# Patient Record
Sex: Male | Born: 1975 | Race: Black or African American | Hispanic: No | Marital: Married | Smoking: Never smoker
Health system: Southern US, Community
[De-identification: ages and names within clinical notes are randomized; demographics above are authoritative.]

## PROBLEM LIST (undated history)

## (undated) DIAGNOSIS — I10 Essential (primary) hypertension: Secondary | ICD-10-CM

## (undated) DIAGNOSIS — Z8711 Personal history of peptic ulcer disease: Secondary | ICD-10-CM

---

## 2001-01-15 ENCOUNTER — Ambulatory Visit (HOSPITAL_COMMUNITY): Admission: RE | Admit: 2001-01-15 | Discharge: 2001-01-15 | Payer: Self-pay | Admitting: Neurology

## 2001-01-15 ENCOUNTER — Encounter: Payer: Self-pay | Admitting: Neurology

## 2003-05-05 ENCOUNTER — Encounter: Payer: Self-pay | Admitting: Emergency Medicine

## 2003-05-05 ENCOUNTER — Emergency Department (HOSPITAL_COMMUNITY): Admission: EM | Admit: 2003-05-05 | Discharge: 2003-05-06 | Payer: Self-pay | Admitting: Emergency Medicine

## 2003-06-20 ENCOUNTER — Encounter (HOSPITAL_COMMUNITY): Admission: RE | Admit: 2003-06-20 | Discharge: 2003-07-20 | Payer: Self-pay | Admitting: Orthopedic Surgery

## 2003-07-27 ENCOUNTER — Emergency Department (HOSPITAL_COMMUNITY): Admission: EM | Admit: 2003-07-27 | Discharge: 2003-07-27 | Payer: Self-pay | Admitting: Emergency Medicine

## 2003-07-27 ENCOUNTER — Encounter: Payer: Self-pay | Admitting: Emergency Medicine

## 2004-01-21 ENCOUNTER — Emergency Department (HOSPITAL_COMMUNITY): Admission: AD | Admit: 2004-01-21 | Discharge: 2004-01-21 | Payer: Self-pay | Admitting: Emergency Medicine

## 2005-01-22 ENCOUNTER — Emergency Department: Payer: Self-pay | Admitting: Emergency Medicine

## 2005-09-08 ENCOUNTER — Emergency Department: Payer: Self-pay | Admitting: Emergency Medicine

## 2005-09-10 ENCOUNTER — Emergency Department: Payer: Self-pay | Admitting: Emergency Medicine

## 2006-05-02 ENCOUNTER — Emergency Department: Payer: Self-pay | Admitting: Emergency Medicine

## 2006-05-11 ENCOUNTER — Emergency Department (HOSPITAL_COMMUNITY): Admission: EM | Admit: 2006-05-11 | Discharge: 2006-05-11 | Payer: Self-pay | Admitting: Emergency Medicine

## 2006-05-26 ENCOUNTER — Emergency Department: Payer: Self-pay | Admitting: Internal Medicine

## 2007-05-24 ENCOUNTER — Emergency Department: Payer: Self-pay | Admitting: Emergency Medicine

## 2009-04-09 ENCOUNTER — Emergency Department (HOSPITAL_COMMUNITY): Admission: EM | Admit: 2009-04-09 | Discharge: 2009-04-09 | Payer: Self-pay | Admitting: Emergency Medicine

## 2009-04-10 ENCOUNTER — Emergency Department (HOSPITAL_COMMUNITY): Admission: EM | Admit: 2009-04-10 | Discharge: 2009-04-10 | Payer: Self-pay | Admitting: Emergency Medicine

## 2009-07-25 ENCOUNTER — Emergency Department (HOSPITAL_COMMUNITY): Admission: EM | Admit: 2009-07-25 | Discharge: 2009-07-25 | Payer: Self-pay | Admitting: Emergency Medicine

## 2009-11-29 ENCOUNTER — Emergency Department (HOSPITAL_COMMUNITY): Admission: EM | Admit: 2009-11-29 | Discharge: 2009-11-29 | Payer: Self-pay | Admitting: Emergency Medicine

## 2010-08-05 ENCOUNTER — Emergency Department (HOSPITAL_COMMUNITY): Admission: EM | Admit: 2010-08-05 | Discharge: 2010-08-05 | Payer: Self-pay | Admitting: Emergency Medicine

## 2010-12-19 LAB — RAPID STREP SCREEN (MED CTR MEBANE ONLY): Streptococcus, Group A Screen (Direct): NEGATIVE

## 2011-04-10 ENCOUNTER — Emergency Department (HOSPITAL_COMMUNITY)
Admission: EM | Admit: 2011-04-10 | Discharge: 2011-04-10 | Disposition: A | Discharge status: Home / Self Care | Payer: 59 | Attending: Emergency Medicine | Admitting: Emergency Medicine

## 2011-04-10 DIAGNOSIS — K047 Periapical abscess without sinus: Secondary | ICD-10-CM | POA: Insufficient documentation

## 2013-12-11 ENCOUNTER — Encounter (HOSPITAL_COMMUNITY): Payer: Self-pay | Admitting: Emergency Medicine

## 2013-12-11 ENCOUNTER — Emergency Department (HOSPITAL_COMMUNITY)
Admission: EM | Admit: 2013-12-11 | Discharge: 2013-12-11 | Disposition: A | Discharge status: Home / Self Care | Payer: Medicaid Other | Attending: Emergency Medicine | Admitting: Emergency Medicine

## 2013-12-11 DIAGNOSIS — R109 Unspecified abdominal pain: Secondary | ICD-10-CM

## 2013-12-11 DIAGNOSIS — R1013 Epigastric pain: Secondary | ICD-10-CM | POA: Insufficient documentation

## 2013-12-11 DIAGNOSIS — R51 Headache: Secondary | ICD-10-CM | POA: Insufficient documentation

## 2013-12-11 DIAGNOSIS — D72819 Decreased white blood cell count, unspecified: Secondary | ICD-10-CM | POA: Insufficient documentation

## 2013-12-11 DIAGNOSIS — R519 Headache, unspecified: Secondary | ICD-10-CM

## 2013-12-11 DIAGNOSIS — R1012 Left upper quadrant pain: Secondary | ICD-10-CM | POA: Insufficient documentation

## 2013-12-11 DIAGNOSIS — R1011 Right upper quadrant pain: Secondary | ICD-10-CM | POA: Insufficient documentation

## 2013-12-11 LAB — CBC WITH DIFFERENTIAL/PLATELET
BASOS ABS: 0.1 10*3/uL (ref 0.0–0.1)
BASOS PCT: 1 % (ref 0–1)
EOS PCT: 3 % (ref 0–5)
Eosinophils Absolute: 0.1 10*3/uL (ref 0.0–0.7)
HCT: 41.9 % (ref 39.0–52.0)
HEMOGLOBIN: 13.5 g/dL (ref 13.0–17.0)
Lymphocytes Relative: 47 % — ABNORMAL HIGH (ref 12–46)
Lymphs Abs: 1.6 10*3/uL (ref 0.7–4.0)
MCH: 28.1 pg (ref 26.0–34.0)
MCHC: 32.2 g/dL (ref 30.0–36.0)
MCV: 87.1 fL (ref 78.0–100.0)
Monocytes Absolute: 0.4 10*3/uL (ref 0.1–1.0)
Monocytes Relative: 11 % (ref 3–12)
Neutro Abs: 1.3 10*3/uL — ABNORMAL LOW (ref 1.7–7.7)
Neutrophils Relative %: 38 % — ABNORMAL LOW (ref 43–77)
PLATELETS: 188 10*3/uL (ref 150–400)
RBC: 4.81 MIL/uL (ref 4.22–5.81)
RDW: 12.4 % (ref 11.5–15.5)
WBC: 3.5 10*3/uL — AB (ref 4.0–10.5)

## 2013-12-11 LAB — COMPREHENSIVE METABOLIC PANEL
ALBUMIN: 3.8 g/dL (ref 3.5–5.2)
ALK PHOS: 64 U/L (ref 39–117)
ALT: 28 U/L (ref 0–53)
AST: 24 U/L (ref 0–37)
BUN: 14 mg/dL (ref 6–23)
CO2: 30 meq/L (ref 19–32)
Calcium: 9 mg/dL (ref 8.4–10.5)
Chloride: 104 mEq/L (ref 96–112)
Creatinine, Ser: 1.15 mg/dL (ref 0.50–1.35)
GFR calc non Af Amer: 81 mL/min — ABNORMAL LOW (ref >90)
GLUCOSE: 93 mg/dL (ref 70–99)
Potassium: 4.3 mEq/L (ref 3.7–5.3)
SODIUM: 142 meq/L (ref 137–147)
Total Bilirubin: 1.4 mg/dL — ABNORMAL HIGH (ref 0.3–1.2)
Total Protein: 7.3 g/dL (ref 6.0–8.3)

## 2013-12-11 LAB — URINALYSIS, ROUTINE W REFLEX MICROSCOPIC
BILIRUBIN URINE: NEGATIVE
GLUCOSE, UA: NEGATIVE mg/dL
HGB URINE DIPSTICK: NEGATIVE
KETONES UR: NEGATIVE mg/dL
Leukocytes, UA: NEGATIVE
Nitrite: NEGATIVE
PROTEIN: NEGATIVE mg/dL
Urobilinogen, UA: 1 mg/dL (ref 0.0–1.0)
pH: 6 (ref 5.0–8.0)

## 2013-12-11 LAB — LIPASE, BLOOD: Lipase: 20 U/L (ref 11–59)

## 2013-12-11 MED ORDER — OXYCODONE-ACETAMINOPHEN 5-325 MG PO TABS
2.0000 | ORAL_TABLET | Freq: Once | ORAL | Status: AC
  Administered 2013-12-11: 2 via ORAL
  Filled 2013-12-11: qty 2

## 2013-12-11 MED ORDER — FAMOTIDINE 20 MG PO TABS: 20.0000 mg | ORAL_TABLET | Freq: Two times a day (BID) | ORAL | Status: DC

## 2013-12-11 MED ORDER — MORPHINE SULFATE 4 MG/ML IJ SOLN
4.0000 mg | Freq: Once | INTRAMUSCULAR | Status: AC
  Administered 2013-12-11: 4 mg via INTRAVENOUS

## 2013-12-11 MED ORDER — MORPHINE SULFATE 2 MG/ML IJ SOLN
INTRAMUSCULAR | Status: AC
  Filled 2013-12-11: qty 2

## 2013-12-11 MED ORDER — SODIUM CHLORIDE 0.9 % IV BOLUS (SEPSIS)
1000.0000 mL | Freq: Once | INTRAVENOUS | Status: AC
  Administered 2013-12-11: 1000 mL via INTRAVENOUS

## 2013-12-11 MED ORDER — ONDANSETRON HCL 4 MG/2ML IJ SOLN
4.0000 mg | Freq: Once | INTRAMUSCULAR | Status: AC
  Administered 2013-12-11: 4 mg via INTRAVENOUS
  Filled 2013-12-11: qty 2

## 2013-12-11 MED ORDER — FAMOTIDINE IN NACL 20-0.9 MG/50ML-% IV SOLN
20.0000 mg | INTRAVENOUS | Status: AC
  Administered 2013-12-11: 20 mg via INTRAVENOUS
  Filled 2013-12-11: qty 50

## 2013-12-11 NOTE — ED Notes (Signed)
Patient c/o headache x 1 week with associated nausea.

## 2013-12-11 NOTE — Discharge Instructions (Signed)
Your pain may be due to a stomach ulcer or gastritis - see the attached reading instructions.  Stop taking all over the counter pain medicines except for tylenol which does not hurt the stomach.  You should start taking pepcid twice a day for a week, then once a day after that.    Please call your doctor for a followup appointment within 24-48 hours. When you talk to your doctor please let them know that you were seen in the emergency department and have them acquire all of your records so that they can discuss the findings with you and formulate a treatment plan to fully care for your new and ongoing problems.  [facility]    Kari BaarsEdward Hawkins MD. Specialty: Pulmonary Disease Contact information: [address]  [address]  [place] [address]  [phone]   Syliva OvermanMargaret Simpson, MD. Specialty: [facility] Contact information: [address]  [place] [address]  [phone]   Lilyan PuntScott Luking, MD. Specialty: [facility] Contact information: [address]  [address]  [place] [address]  [phone]   Avon Gullyesfaye Fanta, MD Specialty: Internal Medicine Contact information: [address]  [place] [address]  [phone]   Catalina PizzaZach Hall, MD. Specialty: Internal Medicine Contact information: [address]  [place] [address]  [phone]   Butch PennyAngus Mcinnis, MD. Specialty: Family Medicine Contact information: [address]  [place] [address]  [phone]   John GiovanniStephen Knowlton, MD. Specialty: [facility] Contact information: [address]  [address]  [place] [address]  [phone]   Carylon Perchesoy Fagan, MD. Specialty: Internal Medicine Contact information: [address]  [address]  [place] [address]  [phone]

## 2013-12-11 NOTE — ED Provider Notes (Signed)
CSN: [id number]     Arrival date & time 12/11/13  0010 History   First MD Initiated Contact with Patient 12/11/13 [phone]     Chief Complaint  Patient presents with  . Headache     (Consider location/radiation/quality/duration/timing/severity/associated sxs/prior Treatment) HPI Comments: 38 year old male presents with a complaint of headache and a complaint of abdominal pain. The patient has no significant medical history though he does note that he gets frequent headaches and it is not unusual for him to have a headache like he has today.  He states that approximately one week ago he developed some upper abdominal discomfort which he states is an aching and cramping sensation that occurs intermittently throughout the week but seems to get worse when he eats or drinks. Is not positional, it is not exertional, it is not associated with dysuria diarrhea or rectal bleeding and he has had normal bowel movements. He is not vomiting but does have nausea throughout the day which has limited his oral intake. He denies any abdominal surgery, he has minimal alcohol intake and does not use anti-inflammatory medications very often. There is no history of peptic ulcer disease or pancreatitis  The history is provided by the patient and a relative.    History reviewed. No pertinent past medical history. History reviewed. No pertinent past surgical history. No family history on file. History  Substance Use Topics  . Smoking status: Never Smoker   . Smokeless tobacco: Not on file  . Alcohol Use: Yes     Comment: occ    Review of Systems  All other systems reviewed and are negative.      Allergies  Review of patient's allergies indicates no known allergies.  Home Medications   Current Outpatient Rx  Name  Route  Sig  Dispense  Refill  . famotidine (PEPCID) 20 MG tablet   Oral   Take 1 tablet (20 mg total) by mouth 2 (two) times daily.   30 tablet   0    BP 175/74  Pulse 73  Temp(Src)  98.1 F (36.7 C) (Oral)  Resp 18  Ht 6\' 4"  (1.93 m)  Wt 226 lb 8 oz (102.74 kg)  BMI 27.58 kg/m2  SpO2 99% Physical Exam  Nursing note and vitals reviewed. Constitutional: He appears well-developed and well-nourished. No distress.  HENT:  Head: Normocephalic and atraumatic.  Mouth/Throat: Oropharynx is clear and moist. No oropharyngeal exudate.  Oropharynx is clear and moist  Eyes: Conjunctivae and EOM are normal. Pupils are equal, round, and reactive to light. Right eye exhibits no discharge. Left eye exhibits no discharge. No scleral icterus.  Neck: Normal range of motion. Neck supple. No JVD present. No thyromegaly present.  No lymphadenopathy  Cardiovascular: Normal rate, regular rhythm, normal heart sounds and intact distal pulses.  Exam reveals no gallop and no friction rub.   No murmur heard. Pulmonary/Chest: Effort normal and breath sounds normal. No respiratory distress. He has no wheezes. He has no rales.  Abdominal: Soft. Bowel sounds are normal. He exhibits no distension and no mass. There is tenderness (minimal epigastric, left upper quadrant and right upper quadrant tenderness to palpation).  No tenderness at McBurney's point, no guarding or peritoneal signs  Musculoskeletal: Normal range of motion. He exhibits no edema and no tenderness.  Lymphadenopathy:    He has no cervical adenopathy.  Neurological: He is alert. Coordination normal.  Clear speech, coordinated movements, normal strength gait and sensation. Cranial nerves III through XII intact  Skin: Skin is  warm and dry. No rash noted. No erythema.  Psychiatric: He has a normal mood and affect. His behavior is normal.    ED Course  Procedures (including critical care time) Labs Review Labs Reviewed  COMPREHENSIVE METABOLIC PANEL - Abnormal; Notable for the following:    Total Bilirubin 1.4 (*)    GFR calc non Af Amer 81 (*)    All other components within normal limits  CBC WITH DIFFERENTIAL - Abnormal;  Notable for the following:    WBC 3.5 (*)    Neutrophils Relative % 38 (*)    Neutro Abs 1.3 (*)    Lymphocytes Relative 47 (*)    All other components within normal limits  URINALYSIS, ROUTINE W REFLEX MICROSCOPIC - Abnormal; Notable for the following:    Specific Gravity, Urine >1.030 (*)    All other components within normal limits  LIPASE, BLOOD   Imaging Review No results found.   EKG Interpretation None      MDM   Final diagnoses:  Abdominal pain  Headache  Leukopenia    The patient presents with normal vital signs other than mild hypertension, he has a small amount of concern for upper abdominal pathology including pancreatitis, cholecystitis or hepatic dysfunction. He is not in distress and will initiate a workup with laboratory testing, pain medication and nausea medication offered.  Improved after meds, labs show leukopenia - pt informed and encouraged to f/u - he now states he has no rf for HIV and no s/s of cancer, he has been using a lot of OTC meds including ASA (BC powders).  I have cautioned him to stop using these meds, he agrees, possible PUD / gastritis  Meds given in ED:  Medications  famotidine (PEPCID) IVPB 20 mg (not administered)  oxyCODONE-acetaminophen (PERCOCET/ROXICET) 5-325 MG per tablet 2 tablet (not administered)  ondansetron (ZOFRAN) injection 4 mg (4 mg Intravenous Given 12/11/13 0515)  sodium chloride 0.9 % bolus 1,000 mL (1,000 mLs Intravenous New Bag/Given 12/11/13 0516)  morphine 4 MG/ML injection 4 mg (4 mg Intravenous Given 12/11/13 0516)  morphine 2 MG/ML injection (  Duplicate 12/11/13 0516)    New Prescriptions   FAMOTIDINE (PEPCID) 20 MG TABLET    Take 1 tablet (20 mg total) by mouth 2 (two) times daily.      Vida Roller, MD 12/11/13 [phone]

## 2013-12-13 ENCOUNTER — Emergency Department (HOSPITAL_COMMUNITY)
Admission: EM | Admit: 2013-12-13 | Discharge: 2013-12-13 | Disposition: A | Discharge status: Home / Self Care | Payer: Medicaid Other | Attending: Emergency Medicine | Admitting: Emergency Medicine

## 2013-12-13 ENCOUNTER — Encounter (HOSPITAL_COMMUNITY): Payer: Self-pay | Admitting: Emergency Medicine

## 2013-12-13 DIAGNOSIS — K279 Peptic ulcer, site unspecified, unspecified as acute or chronic, without hemorrhage or perforation: Secondary | ICD-10-CM

## 2013-12-13 DIAGNOSIS — R109 Unspecified abdominal pain: Secondary | ICD-10-CM

## 2013-12-13 LAB — URINALYSIS, ROUTINE W REFLEX MICROSCOPIC
Bilirubin Urine: NEGATIVE
Glucose, UA: NEGATIVE mg/dL
Hgb urine dipstick: NEGATIVE
Ketones, ur: NEGATIVE mg/dL
Leukocytes, UA: NEGATIVE
Nitrite: NEGATIVE
Protein, ur: NEGATIVE mg/dL
Specific Gravity, Urine: 1.015 (ref 1.005–1.030)
Urobilinogen, UA: 1 mg/dL (ref 0.0–1.0)
pH: 8 (ref 5.0–8.0)

## 2013-12-13 LAB — CBC
HCT: 41.5 % (ref 39.0–52.0)
Hemoglobin: 13.2 g/dL (ref 13.0–17.0)
MCH: 27.4 pg (ref 26.0–34.0)
MCHC: 31.8 g/dL (ref 30.0–36.0)
MCV: 86.3 fL (ref 78.0–100.0)
Platelets: 184 10*3/uL (ref 150–400)
RBC: 4.81 MIL/uL (ref 4.22–5.81)
RDW: 12.3 % (ref 11.5–15.5)
WBC: 3.4 10*3/uL — ABNORMAL LOW (ref 4.0–10.5)

## 2013-12-13 LAB — COMPREHENSIVE METABOLIC PANEL
ALT: 27 U/L (ref 0–53)
AST: 22 U/L (ref 0–37)
Albumin: 4.1 g/dL (ref 3.5–5.2)
Alkaline Phosphatase: 70 U/L (ref 39–117)
BUN: 9 mg/dL (ref 6–23)
CO2: 30 mEq/L (ref 19–32)
Calcium: 9.1 mg/dL (ref 8.4–10.5)
Chloride: 103 mEq/L (ref 96–112)
Creatinine, Ser: 1.07 mg/dL (ref 0.50–1.35)
GFR calc Af Amer: >90 mL/min (ref >90)
GFR calc non Af Amer: 88 mL/min — ABNORMAL LOW (ref >90)
Glucose, Bld: 98 mg/dL (ref 70–99)
Potassium: 4 mEq/L (ref 3.7–5.3)
Sodium: 142 mEq/L (ref 137–147)
Total Bilirubin: 1.1 mg/dL (ref 0.3–1.2)
Total Protein: 7.6 g/dL (ref 6.0–8.3)

## 2013-12-13 MED ORDER — FAMOTIDINE 20 MG PO TABS: 20.0000 mg | ORAL_TABLET | Freq: Two times a day (BID) | ORAL | Status: DC

## 2013-12-13 MED ORDER — FAMOTIDINE 20 MG PO TABS
40.0000 mg | ORAL_TABLET | Freq: Once | ORAL | Status: AC
  Administered 2013-12-13: 40 mg via ORAL
  Filled 2013-12-13: qty 2

## 2013-12-13 MED ORDER — GI COCKTAIL ~~LOC~~
30.0000 mL | Freq: Once | ORAL | Status: AC
  Administered 2013-12-13: 30 mL via ORAL
  Filled 2013-12-13: qty 30

## 2013-12-13 NOTE — Care Management Note (Signed)
ED/CM noted patient did not have health insurance and/or PCP listed in the computer.  Patient was given the [place] resource handout with information on the clinics, food pantries, and the handout for new health insurance sign-up.  Patient expressed appreciation for information received. 

## 2013-12-13 NOTE — ED Provider Notes (Signed)
CSN: [id number]     Arrival date & time 12/13/13  1425 History   First MD Initiated Contact with Patient 12/13/13 1527     Chief Complaint  Patient presents with  . Pain     (Consider location/radiation/quality/duration/timing/severity/associated sxs/prior Treatment) HPI  37yM with abdominal pain. Epigastric to periumbilical. Associated with nausea. No vomiting. No fever or chills. Pain worse shortly after eating. Does not radiate. No vomiting. No diarrhea. No urinary complaints. No sick contacts. No significant alcohol use. Constant and NSAIDs. Seen in the emergency room on 3/7 for the same complaint. She was prescribed Pepcid, but did not fill this prescription.  History reviewed. No pertinent past medical history. History reviewed. No pertinent past surgical history. No family history on file. History  Substance Use Topics  . Smoking status: Never Smoker   . Smokeless tobacco: Not on file  . Alcohol Use: Yes     Comment: occ    Review of Systems  All systems reviewed and negative, other than as noted in HPI.   Allergies  Review of patient's allergies indicates no known allergies.  Home Medications   Current Outpatient Rx  Name  Route  Sig  Dispense  Refill  . acetaminophen (TYLENOL) 500 MG tablet   Oral   Take 1,000 mg by mouth every 6 (six) hours as needed for mild pain.          BP 149/80  Pulse 98  Temp(Src) 98.1 F (36.7 C) (Oral)  Resp 20  Ht 6\' 4"  (1.93 m)  Wt 226 lb (102.513 kg)  BMI 27.52 kg/m2  SpO2 100% Physical Exam  Nursing note and vitals reviewed. Constitutional: He appears well-developed and well-nourished. No distress.  HENT:  Head: Normocephalic and atraumatic.  Eyes: Conjunctivae are normal. Right eye exhibits no discharge. Left eye exhibits no discharge.  Neck: Neck supple.  Cardiovascular: Normal rate, regular rhythm and normal heart sounds.  Exam reveals no gallop and no friction rub.   No murmur heard. Pulmonary/Chest: Effort  normal and breath sounds normal. No respiratory distress.  Abdominal: Soft. He exhibits no distension. There is tenderness.  Mild epigastric tenderness w/o rebound or gaurding  Genitourinary:  No cva tenderness  Musculoskeletal: He exhibits no edema and no tenderness.  Neurological: He is alert.  Skin: Skin is warm and dry.  Psychiatric: He has a normal mood and affect. His behavior is normal. Thought content normal.    ED Course  Procedures (including critical care time) Labs Review Labs Reviewed  CBC - Abnormal; Notable for the following:    WBC 3.4 (*)    All other components within normal limits  COMPREHENSIVE METABOLIC PANEL  URINALYSIS, ROUTINE W REFLEX MICROSCOPIC   Imaging Review No results found.   EKG Interpretation None      MDM   Final diagnoses:  None   38 year old male with likely PUD. Pretty benign exam. Previous workup was reviewed and unremarkable aside from minimal leukopenia.  Doubt contributory.  Patient did not fill his prescription for H2 blocker.    Raeford RazorStephen Ingeborg Fite, MD 12/19/13 [phone]

## 2013-12-13 NOTE — Discharge Instructions (Signed)
Abdominal Pain, Adult °Many things can cause abdominal pain. Usually, abdominal pain is not caused by a disease and will improve without treatment. It can often be observed and treated at home. Your health care provider will do a physical exam and possibly order blood tests and X-rays to help determine the seriousness of your pain. However, in many cases, more time must pass before a clear cause of the pain can be found. Before that point, your health care provider may not know if you need more testing or further treatment. °HOME CARE INSTRUCTIONS  °Monitor your abdominal pain for any changes. The following actions may help to alleviate any discomfort you are experiencing: °· Only take over-the-counter or prescription medicines as directed by your health care provider. °· Do not take laxatives unless directed to do so by your health care provider. °· Try a clear liquid diet (broth, tea, or water) as directed by your health care provider. Slowly move to a bland diet as tolerated. °SEEK MEDICAL CARE IF: °· You have unexplained abdominal pain. °· You have abdominal pain associated with nausea or diarrhea. °· You have pain when you urinate or have a bowel movement. °· You experience abdominal pain that wakes you in the night. °· You have abdominal pain that is worsened or improved by eating food. °· You have abdominal pain that is worsened with eating fatty foods. °SEEK IMMEDIATE MEDICAL CARE IF:  °· Your pain does not go away within 2 hours. °· You have a fever. °· You keep throwing up (vomiting). °· Your pain is felt only in portions of the abdomen, such as the right side or the left lower portion of the abdomen. °· You pass bloody or black tarry stools. °MAKE SURE YOU: °· Understand these instructions.   °· Will watch your condition.   °· Will get help right away if you are not doing well or get worse.   °Document Released: 07/03/2005 Document Revised: 07/14/2013 Document Reviewed: 06/02/2013 °ExitCare® Patient  Information ©2014 ExitCare, LLC. °  Emergency Department Resource Guide °1) Find a Doctor and Pay Out of Pocket °Although you won't have to find out who is covered by your insurance plan, it is a good idea to ask around and get recommendations. You will then need to call the office and see if the doctor you have chosen will accept you as a new patient and what types of options they offer for patients who are self-pay. Some doctors offer discounts or will set up payment plans for their patients who do not have insurance, but you will need to ask so you aren't surprised when you get to your appointment. ° °2) Contact Your Local Health Department °Not all health departments have doctors that can see patients for sick visits, but many do, so it is worth a call to see if yours does. If you don't know where your local health department is, you can check in your phone book. The CDC also has a tool to help you locate your state's health department, and many state websites also have listings of all of their local health departments. ° °3) Find a Walk-in Clinic °If your illness is not likely to be very severe or complicated, you may want to try a walk in clinic. These are popping up all over the country in pharmacies, drugstores, and shopping centers. They're usually staffed by nurse practitioners or physician assistants that have been trained to treat common illnesses and complaints. They're usually fairly quick and inexpensive. However, if you have   serious medical issues or chronic medical problems, these are probably not your best option. ° °No Primary Care Doctor: °- Call [facility] at  832-8000 - they can help you locate a primary care doctor that  accepts your insurance, provides certain services, etc. °- Physician Referral Service- 1-800-533-3463 ° °Chronic Pain Problems: °Organization         Address  Phone   Notes  °[facility]  (336) 297-2271 Patients need to be referred by their primary care  doctor.  ° °Medication Assistance: °Organization         Address  Phone   Notes  °[place] [facility] [address] °[place], [place] [address] (336) 641-8030 --Must be a resident of [place] °-- Must have NO insurance coverage whatsoever (no Medicaid/ Medicare, etc.) °-- The pt. MUST have a primary care doctor that directs their care regularly and follows them in the community °  °[facility]  (866) 331-1348   °[employer]  (888) 892-1162   ° °Agencies that provide inexpensive medical care: °Organization         Address  Phone   Notes  °[facility]  (336) 832-8035   °[facility]    (336) 832-7272   °[facility] [address] °[place], [place] [address] (336) 832-4777   °[facility] [address], °[place] (336) 271-4999   °[facility]    (336) 373-0678   °[facility]    (336) 272-1050   °[facility] ° [address], [place] Phone:  (336) 832-4444, Fax:  (336) 832-4440 Hours of Operation:  9 am - 6 pm, M-F.  Also accepts Medicaid/Medicare and self-pay.  °[facility] ° [address], [place] Phone: (336) 832-3150, Fax: (336) 832-3151. Hours of Operation:  8:30 am - 5:30 pm, M-F.  Also accepts Medicaid and self-pay.  °[facility] [place] [address], [place] Phone: (336) 878-6027   °[facility] [address], [place], [place] (336)723-1848, Ext. 123 Mondays & Thursdays: 7-9 AM.  First 15 patients are seen on a first come, first serve basis. °  ° °Medicaid-accepting [place] Providers: ° °Organization         Address  Phone   Notes  °[facility] [address], [place] (336) 641-2100 Also accepts self-pay patients.  °[facility] [address], [place] ° (336) 856-9996   °[facility] [address], [place] (336) 288-8857   °[facility] [address] [place] [address], [place] (336) 299-7000   °Veita Bland [address], [place]  ° (336) 373-1557 Only accepts [place] Access Medicaid patients after they have their name applied to their card.  ° °Self-Pay (no insurance) in [place]: ° °Organization         Address  Phone   Notes  °Sickle Cell Patients, [facility] [address], [place] (336) 832-1970   °[facility] [address], [place] (336) 832-4400   °[facility] ° [address], [place] (336) 992-4800   °[facility]/Dr. Osei-Bonsu ° [address] [place] [address], [place] or [address], [place] (336) 841-8500 Phone number for both [place] and [place] locations   is the same.  °[facility] [address], [place] (336) 299-0000   °[facility] [address] [place] [address], [place] or [address] (336) 852-7530 °(336) 878-2260   °[facility] [address], [place] (336) 350-1642, phone; (336) 294-5005, fax Sees patients 1st and 3rd Saturday of every month.  Must not qualify for public or private insurance (i.e. Medicaid, Medicare, [place] Health Choice, Veterans' Benefits) • Household income should be no more than 200% of the poverty level •The clinic cannot treat you if you are pregnant or think you are pregnant • Sexually transmitted diseases are not treated at the clinic.  ° °Dental Care: °Organization         Address  Phone  Notes  °[facility] [address], [place] (336) 641-6152 Accepts children up to age 21 who are enrolled in Medicaid or [place] Health Choice; pregnant women with a Medicaid card; and children who have applied for Medicaid or [place] Health Choice, but were declined, whose parents can pay a reduced fee at time of  service.  °[facility] [place]  [address], [place] (336) 641-7733 Accepts children up to age 21 who are enrolled in Medicaid or [place] Health Choice; pregnant women with a Medicaid card; and children who have applied for Medicaid or [place] Health Choice, but were declined, whose parents can pay a reduced fee at time of service.  °[facility] ° [address], [place] (336) 641-4533 Patients are seen by appointment only. Walk-ins are not accepted. [facility] will see patients 18 years of age and older. °Monday - Tuesday (8am-5pm) °Most Wednesdays (8:30-5pm) °$30 per visit, cash only  °[facility] ° [address], [place] (336) 641-4533 Patients are seen by appointment only. Walk-ins are not accepted. [facility] will see patients 18 years of age and older. °One Wednesday Evening (Monthly: Volunteer Based).  $30 per visit, cash only  °[facility]  (919) 537-3737 for adults; Children under age 4, call Graduate Pediatric Dentistry at (919) [phone]. Children aged 4-14, please call (919) [phone] to request a pediatric application. ° Dental services are provided in all areas of dental care including fillings, crowns and bridges, complete and partial dentures, implants, gum treatment, root canals, and extractions. Preventive care is also provided. Treatment is provided to both adults and children. °Patients are selected via a lottery and there is often a waiting list. °  °[facility] [address], °[place] ° (336) 763-8833 [email or web address] °  °[facility] [address], [place], [place] (336)723-1848, Ext. 123 Second and Fourth Thursday of each month, opens at 6:30 AM; Clinic ends at 9 AM.  Patients are seen on a first-come first-served basis, and a limited number are seen during each clinic.  ° °[facility] ° [address], [place],  [place] (336) 723-7904   Eligibility Requirements °You must have lived in [place], or [place] counties for at least the last three months. °  You cannot be eligible for state or federal sponsored healthcare insurance, including Veterans Administration, Medicaid, or Medicare. °  You generally cannot be eligible for healthcare insurance through your employer.  °  How to apply: °Eligibility screenings are held every Tuesday and Wednesday afternoon from 1:00 pm until 4:00 pm. You do not need an appointment   for the interview!  °[facility] [address], [place], [place] 336-631-2330   °[facility]  336-342-8273   °[facility]  336-703-3100   °[facility]  336-570-6415   ° °Behavioral Health Resources in the Community: °Intensive Outpatient Programs °Organization         Address  Phone  Notes  °[facility] [address], [place], [place] 336-878-6098   °[facility] [address], [place], [place] 336-832-9800   °[facility] [address], [place], [place] ° 336-882-2125   °[facility] [address],  °[place], [place] 1-800-853-5163 or 336-641-4981   °Substance Abuse Resources °Organization         Address  Phone  Notes  °[facility]  336-882-2125   °[facility]  336-784-9470   °[facility]  336-285-9073   °[facility]  336-845-3988   °Residential & Outpatient Substance Abuse Program  1-800-659-3381   °Psychological Services °Organization         Address  Phone  Notes  °[facility]  336- 832-9600   °[facility]  336- 378-7881   °[facility] [address], [place] 1-800-853-5163 or 336-641-4981   ° °Mobile Crisis Teams °Organization         Address  Phone  Notes  °[facility], Mobile Crisis Care Unit  1-877-626-1772   °Assertive °[facility] ° [address] [place], [place] 336-834-9664   °Sharon DeEsch [address] °[place] 336-554-5454   ° °Self-Help/Support Groups °Organization         Address  Phone             Notes  °[facility] - variety of support groups  336- 373-1402 Call for more information  °Narcotics Anonymous (NA), [facility] [address], °[place]  2 meetings at this location  ° °Residential Treatment Programs °Organization         Address  Phone  Notes  °[facility] [address],    °[place]  1-866-801-8205   °[facility] ° [address], [place], [place] 704-293-8524   °[facility] [address], [place] 336-845-3988 Admissions: 8am-3pm M-F  °[facility] [address]    °[place], [place] 336-841-1104   °[facility] [address], [place], [place] 336-379-7146   °[facility] [address]  °[place], [place] 336-285-9073   °[facility] - Intensive Outpatient [address], [place], [place] 336-852-3033   °[facility] [address]  °[place], [place] 1-877-615-2722 or 336-784-9470   °[facility] [address] [place], [place] 336-227-7417 Accepts Medicaid  °[facility] [address]  ° [place] 1-800-659-3381 Substance Abuse/Addiction Treatment  ° °[place] Behavioral Health Resources °Organization         Address  Phone  Notes  °[facility]  (888) 581-9988   °Julie Brannon, PhD [address], [place]   (336) 349-5553 or (336) 951-0000   ° Behavioral   [address] °[place], [place] (336) 349-4454   °[facility] [address], [place],  (336) 342-8316 Insurance/Medicaid/sponsorship through [facility]  °[facility] [address]                                      [place], [place] (336) 342-8316  Therapy/tele-psych/case  °[facility] [address]  ° [place], [place] (336) 349-2233    °Dr. Arfeen  (336) 349-4544   °[facility] [place]  [employer] [place] [facility] 1) [address], [place] °2) [address], [place] °3)  [address], [place] (336) 349-3220 °(336) 342-7768 ° °(336) 342-8140   °[place] [facility] (336) 342-1394 or (336) 342-3537 (After Hours)    ° °   °

## 2013-12-13 NOTE — ED Notes (Signed)
Pt states continued pain to abdomen, epigastric area, and headache. Nausea.

## 2014-05-04 ENCOUNTER — Emergency Department: Payer: Self-pay | Admitting: Emergency Medicine

## 2014-11-10 ENCOUNTER — Encounter (HOSPITAL_COMMUNITY): Payer: Self-pay | Admitting: *Deleted

## 2014-11-10 ENCOUNTER — Emergency Department (HOSPITAL_COMMUNITY)
Admission: EM | Admit: 2014-11-10 | Discharge: 2014-11-10 | Disposition: A | Discharge status: Home / Self Care | Payer: Medicaid Other | Attending: Emergency Medicine | Admitting: Emergency Medicine

## 2014-11-10 DIAGNOSIS — S40861A Insect bite (nonvenomous) of right upper arm, initial encounter: Secondary | ICD-10-CM | POA: Diagnosis not present

## 2014-11-10 DIAGNOSIS — S50861A Insect bite (nonvenomous) of right forearm, initial encounter: Secondary | ICD-10-CM | POA: Insufficient documentation

## 2014-11-10 DIAGNOSIS — W57XXXA Bitten or stung by nonvenomous insect and other nonvenomous arthropods, initial encounter: Secondary | ICD-10-CM | POA: Insufficient documentation

## 2014-11-10 DIAGNOSIS — Y9389 Activity, other specified: Secondary | ICD-10-CM | POA: Insufficient documentation

## 2014-11-10 DIAGNOSIS — Y998 Other external cause status: Secondary | ICD-10-CM | POA: Insufficient documentation

## 2014-11-10 DIAGNOSIS — R21 Rash and other nonspecific skin eruption: Secondary | ICD-10-CM | POA: Diagnosis present

## 2014-11-10 DIAGNOSIS — Z79899 Other long term (current) drug therapy: Secondary | ICD-10-CM | POA: Diagnosis not present

## 2014-11-10 DIAGNOSIS — S60861A Insect bite (nonvenomous) of right wrist, initial encounter: Secondary | ICD-10-CM | POA: Insufficient documentation

## 2014-11-10 DIAGNOSIS — Y9289 Other specified places as the place of occurrence of the external cause: Secondary | ICD-10-CM | POA: Diagnosis not present

## 2014-11-10 MED ORDER — PREDNISONE 20 MG PO TABS: 40.0000 mg | ORAL_TABLET | Freq: Every day | ORAL | Status: DC

## 2014-11-10 NOTE — Discharge Instructions (Signed)
Insect Bite Mosquitoes, flies, fleas, bedbugs, and other insects can bite. Insect bites are different from insect stings. The bite may be red, puffy (swollen), and itchy for 2 to 4 days. Most bites get better on their own. HOME CARE   Do not scratch the bite.  Keep the bite clean and dry. Wash the bite with soap and water.  Put ice on the bite.  Put ice in a plastic bag.  Place a towel between your skin and the bag.  Leave the ice on for 20 minutes, 4 times a day. Do this for the first 2 to 3 days, or as told by your doctor.  You may use medicated lotions or creams to lessen itching as told by your doctor.  Only take medicines as told by your doctor.  If you are given medicines (antibiotics), take them as told. Finish them even if you start to feel better. You may need a tetanus shot if:  You cannot remember when you had your last tetanus shot.  You have never had a tetanus shot.  The injury broke your skin. If you need a tetanus shot and you choose not to have one, you may get tetanus. Sickness from tetanus can be serious. GET HELP RIGHT AWAY IF:   You have more pain, redness, or puffiness.  You see a red line on the skin coming from the bite.  You have a fever.  You have joint pain.  You have a headache or neck pain.  You feel weak.  You have a rash.  You have chest pain, or you are short of breath.  You have belly (abdominal) pain.  You feel sick to your stomach (nauseous) or throw up (vomit).  You feel very tired or sleepy. MAKE SURE YOU:   Understand these instructions.  Will watch your condition.  Will get help right away if you are not doing well or get worse. Document Released: 09/20/2000 Document Revised: 12/16/2011 Document Reviewed: 04/24/2011 ExitCare Patient Information 2015 ExitCare, [employer]. This information is not intended to replace advice given to you by your health care provider. Make sure you discuss any questions you have with your health  care provider.  

## 2014-11-10 NOTE — ED Notes (Signed)
Itching rash to rt arm and and hand

## 2014-11-12 NOTE — ED Provider Notes (Signed)
CSN: [id number]     Arrival date & time 11/10/14  1707 History   First MD Initiated Contact with Patient 11/10/14 1724     Chief Complaint  Patient presents with  . Rash     (Consider location/radiation/quality/duration/timing/severity/associated sxs/prior Treatment) HPI   Raymond Mcintyre LowerJermaine Mcintyre is a 39 y.o. male who presents to the Emergency Department complaining of itching rash to his right upper arm and forearm.  He states the rash has been present for several days and he noticed it after carrying firewood. He denies fever, swelling or similar rash elsewhere.  He has not tried any therapies prior to arrival.      History reviewed. No pertinent past medical history. History reviewed. No pertinent past surgical history. History reviewed. No pertinent family history. History  Substance Use Topics  . Smoking status: Never Smoker   . Smokeless tobacco: Not on file  . Alcohol Use: Yes     Comment: occ    Review of Systems  Constitutional: Negative for fever, chills, activity change and appetite change.  HENT: Negative for facial swelling, sore throat and trouble swallowing.   Respiratory: Negative for chest tightness, shortness of breath and wheezing.   Musculoskeletal: Negative for neck pain and neck stiffness.  Skin: Positive for rash. Negative for wound.  Neurological: Negative for dizziness, weakness, numbness and headaches.  All other systems reviewed and are negative.     Allergies  Review of patient's allergies indicates no known allergies.  Home Medications   Prior to Admission medications   Medication Sig Start Date End Date Taking? Authorizing Provider  acetaminophen (TYLENOL) 500 MG tablet Take 1,000 mg by mouth every 6 (six) hours as needed for mild pain.    Historical Provider, MD  famotidine (PEPCID) 20 MG tablet Take 1 tablet (20 mg total) by mouth 2 (two) times daily. 12/13/13   Raeford RazorStephen Kohut, MD  predniSONE (DELTASONE) 20 MG tablet Take 2 tablets (40 mg  total) by mouth daily. For 5 days 11/10/14   Ashdon Gillson L. Iyari Hagner, PA-C   BP 119/75 mmHg  Pulse 82  Temp(Src) 98.8 F (37.1 C) (Oral)  Resp 18  Ht 6\' 4"  (1.93 m)  Wt 210 lb (95.255 kg)  BMI 25.57 kg/m2  SpO2 99% Physical Exam  Constitutional: He is oriented to person, place, and time. He appears well-developed and well-nourished. No distress.  HENT:  Head: Normocephalic and atraumatic.  Mouth/Throat: Oropharynx is clear and moist.  Neck: Normal range of motion. Neck supple.  Cardiovascular: Normal rate, regular rhythm, normal heart sounds and intact distal pulses.   No murmur heard. Pulmonary/Chest: Effort normal and breath sounds normal. No respiratory distress.  Musculoskeletal: He exhibits no edema or tenderness.  Lymphadenopathy:    He has no cervical adenopathy.  Neurological: He is alert and oriented to person, place, and time. He exhibits normal muscle tone. Coordination normal.  Skin: Skin is warm. Rash noted. There is erythema.  Scattered erythematous papules to the right upper arm and forearm, extends to right wrist.  No induration or vesicles.  Nursing note and vitals reviewed.   ED Course  Procedures (including critical care time) Labs Review Labs Reviewed - No data to display  Imaging Review No results found.   EKG Interpretation None      MDM   Final diagnoses:  Insect bites   Rash appears c/w insect bites.  Pt agrees to prednisone taper, benadryl for itching and cool compresses.  No edema .  Vitals stable and pt stable  for d/c    Raymond Mcintyre L. Rowe Robert 11/12/14 2328  Raeford Razor, MD 11/14/14 [phone]

## 2015-06-01 ENCOUNTER — Encounter (HOSPITAL_COMMUNITY): Payer: Self-pay | Admitting: Cardiology

## 2015-06-01 ENCOUNTER — Emergency Department (HOSPITAL_COMMUNITY)
Admission: EM | Admit: 2015-06-01 | Discharge: 2015-06-01 | Disposition: A | Discharge status: Home / Self Care | Payer: Medicaid Other | Attending: Emergency Medicine | Admitting: Emergency Medicine

## 2015-06-01 DIAGNOSIS — L03115 Cellulitis of right lower limb: Secondary | ICD-10-CM | POA: Insufficient documentation

## 2015-06-01 MED ORDER — CLINDAMYCIN HCL 300 MG PO CAPS: 300.0000 mg | ORAL_CAPSULE | Freq: Four times a day (QID) | ORAL | Status: DC

## 2015-06-01 NOTE — ED Provider Notes (Signed)
CSN: [id number]     Arrival date & time 06/01/15  1411 History   First MD Initiated Contact with Patient 06/01/15 1432     Chief Complaint  Patient presents with  . Knee Pain     (Consider location/radiation/quality/duration/timing/severity/associated sxs/prior Treatment) HPI Comments: Pt comes in with c/o right knee pain. Denies injury. States that the area was more swollen yesterday and he pushed on the area and got some pus out. He denies problems with movement of his knee. She states that not there is just a scab to the area.  The history is provided by the patient. No language interpreter was used.    History reviewed. No pertinent past medical history. History reviewed. No pertinent past surgical history. History reviewed. No pertinent family history. Social History  Substance Use Topics  . Smoking status: Never Smoker   . Smokeless tobacco: None  . Alcohol Use: Yes     Comment: occ    Review of Systems  All other systems reviewed and are negative.     Allergies  Review of patient's allergies indicates no known allergies.  Home Medications   Prior to Admission medications   Medication Sig Start Date End Date Taking? Authorizing Provider  acetaminophen (TYLENOL) 500 MG tablet Take 1,000 mg by mouth every 6 (six) hours as needed for mild pain.   Yes Historical Provider, MD  clindamycin (CLEOCIN) 300 MG capsule Take 1 capsule (300 mg total) by mouth every 6 (six) hours. 06/01/15   Teressa Lower, NP  predniSONE (DELTASONE) 20 MG tablet Take 2 tablets (40 mg total) by mouth daily. For 5 days Patient not taking: Reported on 06/01/2015 11/10/14   Tammy Triplett, PA-C   BP 157/87 mmHg  Pulse 85  Temp(Src) 98.2 F (36.8 C) (Oral)  Resp 16  Ht  (1.93 m)  Wt 200 lb (90.719 kg)  BMI 24.35 kg/m2  SpO2 99% Physical Exam  Constitutional: He is oriented to person, place, and time. He appears well-developed and well-nourished.  Cardiovascular: Regular rhythm.     Pulmonary/Chest: Effort normal and breath sounds normal.  Musculoskeletal: Normal range of motion.       Legs: Red warm area with scab to the center. No fluctuance noted  Neurological: He is alert and oriented to person, place, and time.  Nursing note and vitals reviewed.   ED Course  Procedures (including critical care time) Labs Review Labs Reviewed - No data to display  Imaging Review No results found. I have personally reviewed and evaluated these images and lab results as part of my medical decision-making.   EKG Interpretation None      MDM   Final diagnoses:  Cellulitis of knee, right    Appears to be a skin abnormality. Will treat for cellulitis with clindamycin. Pt given return precaution. Area affected is above the joint.    Teressa Lower, NP 06/01/15 1457  Benjiman Core, MD 06/01/15 [phone]

## 2015-06-01 NOTE — ED Notes (Signed)
Right knee pain since last night.  Today states his knee is swollen.  Denies any injury.

## 2015-06-01 NOTE — Discharge Instructions (Signed)

## 2015-07-21 ENCOUNTER — Encounter (HOSPITAL_COMMUNITY): Payer: Self-pay | Admitting: Emergency Medicine

## 2015-07-21 ENCOUNTER — Emergency Department (HOSPITAL_COMMUNITY)
Admission: EM | Admit: 2015-07-21 | Discharge: 2015-07-21 | Disposition: A | Discharge status: Home / Self Care | Payer: Medicaid Other | Attending: Emergency Medicine | Admitting: Emergency Medicine

## 2015-07-21 DIAGNOSIS — L03115 Cellulitis of right lower limb: Secondary | ICD-10-CM | POA: Insufficient documentation

## 2015-07-21 DIAGNOSIS — L02415 Cutaneous abscess of right lower limb: Secondary | ICD-10-CM | POA: Insufficient documentation

## 2015-07-21 MED ORDER — HYDROCODONE-ACETAMINOPHEN 5-325 MG PO TABS
2.0000 | ORAL_TABLET | Freq: Once | ORAL | Status: AC
  Administered 2015-07-21: 2 via ORAL
  Filled 2015-07-21: qty 2

## 2015-07-21 MED ORDER — LIDOCAINE-EPINEPHRINE (PF) 2 %-1:200000 IJ SOLN
20.0000 mL | Freq: Once | INTRAMUSCULAR | Status: AC
  Administered 2015-07-21: 20 mL
  Filled 2015-07-21: qty 20

## 2015-07-21 MED ORDER — DOXYCYCLINE HYCLATE 100 MG PO CAPS: 100.0000 mg | ORAL_CAPSULE | Freq: Two times a day (BID) | ORAL | Status: DC

## 2015-07-21 MED ORDER — DOXYCYCLINE HYCLATE 100 MG PO TABS
100.0000 mg | ORAL_TABLET | Freq: Once | ORAL | Status: AC
  Administered 2015-07-21: 100 mg via ORAL
  Filled 2015-07-21: qty 1

## 2015-07-21 NOTE — ED Notes (Signed)
Dry Dressing applied to left lower leg

## 2015-07-21 NOTE — ED Notes (Signed)
Pt states that he has had an abscess to back of left calf for a while now.  States that it busted a few days ago and drained but hurts worse now.,

## 2015-07-21 NOTE — Discharge Instructions (Signed)
It was our pleasure to provide your ER care today - we hope that you feel better.  Keep area very clean.  Take antibiotic (doxycycline) as prescribed.  Take motrin or aleve as need for pain.  Return in 2 days for wound check and packing removal.  If recurrent swelling/mass to area, follow up with general surgeon.  Return to ER if worse, new symptoms, spreading redness, fevers, severe pain, other concern.  You were given pain medication in the ER - no driving for the next 4 hours.        Abscess An abscess is an infected area that contains a collection of pus and debris.It can occur in almost any part of the body. An abscess is also known as a furuncle or boil. CAUSES  An abscess occurs when tissue gets infected. This can occur from blockage of oil or sweat glands, infection of hair follicles, or a minor injury to the skin. As the body tries to fight the infection, pus collects in the area and creates pressure under the skin. This pressure causes pain. People with weakened immune systems have difficulty fighting infections and get certain abscesses more often.  SYMPTOMS Usually an abscess develops on the skin and becomes a painful mass that is red, warm, and tender. If the abscess forms under the skin, you may feel a moveable soft area under the skin. Some abscesses break open (rupture) on their own, but most will continue to get worse without care. The infection can spread deeper into the body and eventually into the bloodstream, causing you to feel ill.  DIAGNOSIS  Your caregiver will take your medical history and perform a physical exam. A sample of fluid may also be taken from the abscess to determine what is causing your infection. TREATMENT  Your caregiver may prescribe antibiotic medicines to fight the infection. However, taking antibiotics alone usually does not cure an abscess. Your caregiver may need to make a small cut (incision) in the abscess to drain the pus. In some  cases, gauze is packed into the abscess to reduce pain and to continue draining the area. HOME CARE INSTRUCTIONS   Only take over-the-counter or prescription medicines for pain, discomfort, or fever as directed by your caregiver.  If you were prescribed antibiotics, take them as directed. Finish them even if you start to feel better.  If gauze is used, follow your caregiver's directions for changing the gauze.  To avoid spreading the infection:  Keep your draining abscess covered with a bandage.  Wash your hands well.  Do not share personal care items, towels, or whirlpools with others.  Avoid skin contact with others.  Keep your skin and clothes clean around the abscess.  Keep all follow-up appointments as directed by your caregiver. SEEK MEDICAL CARE IF:   You have increased pain, swelling, redness, fluid drainage, or bleeding.  You have muscle aches, chills, or a general ill feeling.  You have a fever. MAKE SURE YOU:   Understand these instructions.  Will watch your condition.  Will get help right away if you are not doing well or get worse.   This information is not intended to replace advice given to you by your health care provider. Make sure you discuss any questions you have with your health care provider.   Document Released: 07/03/2005 Document Revised: 03/24/2012 Document Reviewed: 12/06/2011 Elsevier Interactive Patient Education [employer]2016 [employer]

## 2015-07-21 NOTE — ED Provider Notes (Signed)
CSN: [id number]     Arrival date & time 07/21/15  1552 History   First MD Initiated Contact with Patient 07/21/15 1608     Chief Complaint  Patient presents with  . Abscess     (Consider location/radiation/quality/duration/timing/severity/associated sxs/prior Treatment) Patient is a 39 y.o. male presenting with abscess. The history is provided by the patient.  Abscess Associated symptoms: no fever, no headaches, no nausea and no vomiting   Patient c/o localized area of swelling to posterior aspect of right lower leg superiorly for several months, constant, 'size of extra large egg', in the past few days, acute increased pain/redness to area, w surrounding redness. . Gradual onset. Symptoms constant, persistent. Worse w palpation. No fever or chills. No nv. Does not feel sick or ill.  Indicates on prior abscess of other leg. Denies bug or insect bite. No injury to area.  No wt loss.       History reviewed. No pertinent past medical history. History reviewed. No pertinent past surgical history. History reviewed. No pertinent family history. Social History  Substance Use Topics  . Smoking status: Never Smoker   . Smokeless tobacco: None  . Alcohol Use: Yes     Comment: occ    Review of Systems  Constitutional: Negative for fever and chills.  Gastrointestinal: Negative for nausea and vomiting.  Skin: Negative for rash.  Neurological: Negative for headaches.      Allergies  Review of patient's allergies indicates no known allergies.  Home Medications   Prior to Admission medications   Medication Sig Start Date End Date Taking? Authorizing Provider  acetaminophen (TYLENOL) 500 MG tablet Take 1,000 mg by mouth every 6 (six) hours as needed for mild pain.    Historical Provider, MD  clindamycin (CLEOCIN) 300 MG capsule Take 1 capsule (300 mg total) by mouth every 6 (six) hours. 06/01/15   Teressa LowerVrinda Pickering, NP  predniSONE (DELTASONE) 20 MG tablet Take 2 tablets (40 mg total) by  mouth daily. For 5 days Patient not taking: Reported on 06/01/2015 11/10/14   Tammy Triplett, PA-C   BP 148/67 mmHg  Pulse 82  Temp(Src) 97.4 F (36.3 C) (Oral)  Resp 18  Ht 6\' 4"  (1.93 m)  Wt 230 lb (104.327 kg)  BMI 28.01 kg/m2  SpO2 98% Physical Exam  Constitutional: He appears well-developed and well-nourished. No distress.  HENT:  Head: Atraumatic.  Eyes: No scleral icterus.  Neck: Neck supple. No tracheal deviation present.  Cardiovascular: Normal rate and intact distal pulses.   Pulmonary/Chest: Effort normal. No accessory muscle usage. No respiratory distress.  Musculoskeletal: Normal range of motion.  Approximately 5 cm diameter abscess to posterior right lower leg. No crepitus. +surround erythema/cellulitis x several cm, no lymphangitis. Distal pulses palp. Good rom at knee without pain.   Neurological: He is alert.  Skin: Skin is warm and dry. No rash noted. He is not diaphoretic.  Psychiatric: He has a normal mood and affect.  Nursing note and vitals reviewed.   ED Course  Procedures (including critical care time)     MDM   INCISION AND DRAINAGE Performed by: Suzi RootsSTEINL,Gatlyn Lipari E Consent: Verbal consent obtained. Risks and benefits: risks, benefits and alternatives were discussed Type: abscess  Body area: posterior/upper aspect of right lower leg  Anesthesia: local infiltration  Incision was made with a scalpel.  Local anesthetic: lidocaine 2% w epinephrine  Anesthetic total: 5 ml  Complexity: complex Blunt dissection to break up loculations  Drainage: initial purulent/liquidy, followed by very large amt,  thick, pasty drainage.   Drainage amount: large  Packing material: 1/4 in iodoform gauze  Patient tolerance: Patient tolerated the procedure well with no immediate complications.    Reviewed nursing notes and prior charts for additional history.   Sterile dressing.   Feel likely superimposed acute infection/abscess on top of chronic process,  ?cyst.  Given surrounding erythema, will rx cellulitis. Doxy.   Discussed given chr nature process, possible recurrent accumulation, will refer to gen surg f/u if develops recurrent localized swelling.    pt has ride, does not have to drive, will give dose doxy and pain med in ED.       Cathren Laine, MD 07/21/15 [phone]

## 2015-07-23 ENCOUNTER — Encounter (HOSPITAL_COMMUNITY): Payer: Self-pay | Admitting: Emergency Medicine

## 2015-07-23 ENCOUNTER — Emergency Department (HOSPITAL_COMMUNITY)
Admission: EM | Admit: 2015-07-23 | Discharge: 2015-07-23 | Disposition: A | Discharge status: Home / Self Care | Payer: Medicaid Other | Attending: Emergency Medicine | Admitting: Emergency Medicine

## 2015-07-23 DIAGNOSIS — Z4801 Encounter for change or removal of surgical wound dressing: Secondary | ICD-10-CM | POA: Insufficient documentation

## 2015-07-23 DIAGNOSIS — Z792 Long term (current) use of antibiotics: Secondary | ICD-10-CM | POA: Insufficient documentation

## 2015-07-23 DIAGNOSIS — Z09 Encounter for follow-up examination after completed treatment for conditions other than malignant neoplasm: Secondary | ICD-10-CM

## 2015-07-23 MED ORDER — HYDROCODONE-ACETAMINOPHEN 5-325 MG PO TABS
1.0000 | ORAL_TABLET | Freq: Once | ORAL | Status: AC
  Administered 2015-07-23: 1 via ORAL
  Filled 2015-07-23: qty 1

## 2015-07-23 MED ORDER — HYDROCODONE-ACETAMINOPHEN 5-325 MG PO TABS: 1.0000 | ORAL_TABLET | ORAL | Status: DC | PRN

## 2015-07-23 NOTE — Discharge Instructions (Signed)
Finish taking your antibiotics.  You may take the medication prescribed for pain, use caution as this will make you drowsy.  Do not drive within 4's of taking hydrocodone.  As discussed I recommend a warm water soak or flush every 4 hours while awake for the next several days to help keep this clean and healing.  Change the dressing as needed.

## 2015-07-23 NOTE — ED Notes (Signed)
Patient here to have recheck of abscess to left lower leg. Per patient seen here on Friday and had I&D done. Packing still in place. Patient denies any fevers, or redness. Per friend changed dressing yesterday because drainage had soaked dressing.Patient now reports some pain to posterior aspect of left knee.

## 2015-07-25 NOTE — ED Provider Notes (Signed)
CSN: [id number]     Arrival date & time 07/23/15  [id number] History   First MD Initiated Contact with Patient 07/23/15 1017     Chief Complaint  Patient presents with  . Wound Check     (Consider location/radiation/quality/duration/timing/severity/associated sxs/prior Treatment) The history is provided by the patient and a friend.   Raymond Mcintyre is a 39 y.o. male presenting for an abscess check and packing removal from the abscess that was drained 2 days ago from his left posterior upper calf.  He reports pain is persistent around the abscess site and extends to his posterior knee space.  There has been some continued drainage from the abscess.  He last changed the dressing yesterday and it has not drained through yet.  He denies fevers, chills, nausea and there has been no spreading of redness or swelling beyond the abscess site.  He is taking his doxycycline.  Ibuprofen has not relieved his pain.    History reviewed. No pertinent past medical history. History reviewed. No pertinent past surgical history. History reviewed. No pertinent family history. Social History  Substance Use Topics  . Smoking status: Never Smoker   . Smokeless tobacco: Never Used  . Alcohol Use: Yes     Comment: occ    Review of Systems  Constitutional: Negative for fever and chills.  Respiratory: Negative for shortness of breath and wheezing.   Skin: Positive for wound.  Neurological: Negative for numbness.      Allergies  Review of patient's allergies indicates no known allergies.  Home Medications   Prior to Admission medications   Medication Sig Start Date End Date Taking? Authorizing Provider  doxycycline (VIBRAMYCIN) 100 MG capsule Take 1 capsule (100 mg total) by mouth 2 (two) times daily. 07/21/15  Yes Cathren LaineKevin Steinl, MD  acetaminophen (TYLENOL) 500 MG tablet Take 1,000 mg by mouth every 6 (six) hours as needed for mild pain.    Historical Provider, MD  clindamycin (CLEOCIN) 300 MG  capsule Take 1 capsule (300 mg total) by mouth every 6 (six) hours. Patient not taking: Reported on 07/23/2015 06/01/15   Teressa LowerVrinda Pickering, NP  HYDROcodone-acetaminophen (NORCO/VICODIN) 5-325 MG tablet Take 1 tablet by mouth every 4 (four) hours as needed. 07/23/15   Burgess AmorJulie Lachandra Dettmann, PA-C  predniSONE (DELTASONE) 20 MG tablet Take 2 tablets (40 mg total) by mouth daily. For 5 days Patient not taking: Reported on 06/01/2015 11/10/14   Tammy Triplett, PA-C   BP 135/71 mmHg  Pulse 80  Temp(Src) 98 F (36.7 C) (Oral)  Resp 16  Ht 6\' 4"  (1.93 m)  Wt 230 lb (104.327 kg)  BMI 28.01 kg/m2  SpO2 99% Physical Exam  Constitutional: He is oriented to person, place, and time. He appears well-developed and well-nourished.  HENT:  Head: Normocephalic.  Cardiovascular: Normal rate.   Pulmonary/Chest: Effort normal.  Musculoskeletal: Normal range of motion. He exhibits no edema.  Neurological: He is alert and oriented to person, place, and time. No sensory deficit.  Skin:  I and D site with packing in place left posterior calf.  No surrounding erythema or red streaking.  Packing has purulent dc.    ED Course  Procedures (including critical care time)  Packing removed without difficulty.  Small amounts of residual thick white sebum like dc removed with NS flush and gentle pressure.  No further purulent drainage.  One elongated (0.5 cm) thick  object obtained from the flushing (? Hypertrophied hair follicle?)  MDM   Final diagnoses:  Encounter for  recheck of abscess following incision and drainage    Pt advised to start warm water soaks tid, continue abx, dressing changes twice daily.  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  Advised return here for any worsened sx.  Pt understands and agrees with plan.    Burgess Amor, PA-C 07/25/15 1233  Benjiman Core, MD 07/28/15 [phone]

## 2017-07-20 ENCOUNTER — Emergency Department: Payer: Self-pay

## 2017-07-20 DIAGNOSIS — R197 Diarrhea, unspecified: Secondary | ICD-10-CM | POA: Insufficient documentation

## 2017-07-20 DIAGNOSIS — R112 Nausea with vomiting, unspecified: Secondary | ICD-10-CM | POA: Insufficient documentation

## 2017-07-20 DIAGNOSIS — R1032 Left lower quadrant pain: Secondary | ICD-10-CM | POA: Insufficient documentation

## 2017-07-20 DIAGNOSIS — R1031 Right lower quadrant pain: Secondary | ICD-10-CM | POA: Insufficient documentation

## 2017-07-20 LAB — BASIC METABOLIC PANEL
Anion gap: 11 (ref 5–15)
BUN: 17 mg/dL (ref 6–20)
CO2: 24 mmol/L (ref 22–32)
CREATININE: 1.1 mg/dL (ref 0.61–1.24)
Calcium: 9.3 mg/dL (ref 8.9–10.3)
Chloride: 103 mmol/L (ref 101–111)
GFR calc Af Amer: >60 mL/min (ref >60)
Glucose, Bld: 101 mg/dL — ABNORMAL HIGH (ref 65–99)
Potassium: 3.7 mmol/L (ref 3.5–5.1)
SODIUM: 138 mmol/L (ref 135–145)

## 2017-07-20 LAB — CBC
HCT: 44.3 % (ref 40.0–52.0)
Hemoglobin: 14.2 g/dL (ref 13.0–18.0)
MCH: 27.3 pg (ref 26.0–34.0)
MCHC: 32.1 g/dL (ref 32.0–36.0)
MCV: 85.1 fL (ref 80.0–100.0)
PLATELETS: 177 10*3/uL (ref 150–440)
RBC: 5.2 MIL/uL (ref 4.40–5.90)
RDW: 12.5 % (ref 11.5–14.5)
WBC: 5.7 10*3/uL (ref 3.8–10.6)

## 2017-07-20 LAB — LIPASE, BLOOD: LIPASE: 21 U/L (ref 11–51)

## 2017-07-20 LAB — TROPONIN I: Troponin I: <0.03 ng/mL (ref <0.03)

## 2017-07-20 NOTE — ED Triage Notes (Signed)
Patient to ED for complaint of central chest pain that started today with feeling he couldn't get a deep breath. Recent cold symptoms. States he is around cigarette smoke at work but does not smoke himself. Skin color is normal for ethnicity, mucus membranes moist. Alert and oriented and answers questions appropriately. Speaks in complete sentences without respiratory distress.

## 2017-07-21 ENCOUNTER — Emergency Department: Payer: Self-pay

## 2017-07-21 ENCOUNTER — Encounter: Payer: Self-pay | Admitting: Radiology

## 2017-07-21 ENCOUNTER — Emergency Department
Admission: EM | Admit: 2017-07-21 | Discharge: 2017-07-21 | Disposition: A | Discharge status: Home / Self Care | Payer: Self-pay | Attending: Emergency Medicine | Admitting: Emergency Medicine

## 2017-07-21 DIAGNOSIS — R109 Unspecified abdominal pain: Secondary | ICD-10-CM

## 2017-07-21 DIAGNOSIS — R197 Diarrhea, unspecified: Secondary | ICD-10-CM

## 2017-07-21 DIAGNOSIS — R112 Nausea with vomiting, unspecified: Secondary | ICD-10-CM

## 2017-07-21 LAB — URINE DRUG SCREEN, QUALITATIVE (ARMC ONLY)
AMPHETAMINES, UR SCREEN: NOT DETECTED
BENZODIAZEPINE, UR SCRN: NOT DETECTED
Barbiturates, Ur Screen: NOT DETECTED
Cannabinoid 50 Ng, Ur ~~LOC~~: NOT DETECTED
Cocaine Metabolite,Ur ~~LOC~~: NOT DETECTED
MDMA (Ecstasy)Ur Screen: NOT DETECTED
METHADONE SCREEN, URINE: NOT DETECTED
Opiate, Ur Screen: NOT DETECTED
Phencyclidine (PCP) Ur S: NOT DETECTED
TRICYCLIC, UR SCREEN: NOT DETECTED

## 2017-07-21 LAB — URINALYSIS, COMPLETE (UACMP) WITH MICROSCOPIC
BACTERIA UA: NONE SEEN
Bilirubin Urine: NEGATIVE
Glucose, UA: NEGATIVE mg/dL
Hgb urine dipstick: NEGATIVE
KETONES UR: NEGATIVE mg/dL
Nitrite: NEGATIVE
PROTEIN: 30 mg/dL — AB
Specific Gravity, Urine: 1.026 (ref 1.005–1.030)
pH: 6 (ref 5.0–8.0)

## 2017-07-21 LAB — HEPATIC FUNCTION PANEL
ALBUMIN: 4.3 g/dL (ref 3.5–5.0)
ALK PHOS: 59 U/L (ref 38–126)
ALT: 36 U/L (ref 17–63)
AST: 31 U/L (ref 15–41)
BILIRUBIN TOTAL: 2.2 mg/dL — AB (ref 0.3–1.2)
Bilirubin, Direct: 0.2 mg/dL (ref 0.1–0.5)
Indirect Bilirubin: 2 mg/dL — ABNORMAL HIGH (ref 0.3–0.9)
TOTAL PROTEIN: 7.7 g/dL (ref 6.5–8.1)

## 2017-07-21 MED ORDER — FENTANYL CITRATE (PF) 100 MCG/2ML IJ SOLN
50.0000 ug | Freq: Once | INTRAMUSCULAR | Status: AC
  Administered 2017-07-21: 50 ug via INTRAVENOUS
  Filled 2017-07-21: qty 2

## 2017-07-21 MED ORDER — ONDANSETRON HCL 4 MG/2ML IJ SOLN
4.0000 mg | Freq: Once | INTRAMUSCULAR | Status: AC
  Administered 2017-07-21: 4 mg via INTRAVENOUS
  Filled 2017-07-21: qty 2

## 2017-07-21 MED ORDER — IOPAMIDOL (ISOVUE-300) INJECTION 61%
30.0000 mL | Freq: Once | INTRAVENOUS | Status: AC
  Administered 2017-07-21: 30 mL via ORAL

## 2017-07-21 MED ORDER — ONDANSETRON HCL 4 MG PO TABS: 4.0000 mg | ORAL_TABLET | Freq: Every day | ORAL | 0 refills | Status: DC | PRN

## 2017-07-21 MED ORDER — SODIUM CHLORIDE 0.9 % IV BOLUS (SEPSIS)
1000.0000 mL | Freq: Once | INTRAVENOUS | Status: AC
  Administered 2017-07-21: 1000 mL via INTRAVENOUS

## 2017-07-21 MED ORDER — IOPAMIDOL (ISOVUE-300) INJECTION 61%
100.0000 mL | Freq: Once | INTRAVENOUS | Status: AC | PRN
  Administered 2017-07-21: 100 mL via INTRAVENOUS

## 2017-07-21 NOTE — ED Notes (Signed)
Pt finished drinking contrast. CT called.

## 2017-07-21 NOTE — ED Notes (Signed)
Pt drank first bottle

## 2017-07-21 NOTE — Discharge Instructions (Signed)
Do not drive home after the medications you have received here. If you have any new or worrisome symptoms as discussed please return to the ER.

## 2017-07-21 NOTE — ED Provider Notes (Addendum)
[facility] Emergency Department Provider Note  ____________________________________________   I have reviewed the triage vital signs and the nursing notes.   HISTORY  Chief Complaint Chest Pain (Non radiating) and Emesis    HPI Raymond Mcintyre is a 41 y.o. male  he states that he is usually very healthy does not recall any abdominal surgeries, presents today with nausea vomiting and loose stools for 2 days. He has epigastric and diffuse abdominal pain with this. He denies any chest pain shortness of breath. no fevers, no chills, no hematemesis no melena or bright blood per rectum, nothing makes it better nothing makes it worse has tried nothing but Tylenol as an outpatient.      History reviewed. No pertinent past medical history.  There are no active problems to display for this patient.   History reviewed. No pertinent surgical history.  Prior to Admission medications   Medication Sig Start Date End Date Taking? Authorizing Provider  acetaminophen (TYLENOL) 500 MG tablet Take 1,000 mg by mouth every 6 (six) hours as needed for mild pain.    [provider]  clindamycin (CLEOCIN) 300 MG capsule Take 1 capsule (300 mg total) by mouth every 6 (six) hours. Patient not taking: Reported on 07/23/2015 06/01/15   Teressa Lower, NP  doxycycline (VIBRAMYCIN) 100 MG capsule Take 1 capsule (100 mg total) by mouth 2 (two) times daily. 07/21/15   Cathren Laine, MD  HYDROcodone-acetaminophen (NORCO/VICODIN) 5-325 MG tablet Take 1 tablet by mouth every 4 (four) hours as needed. 07/23/15   Burgess Amor, PA-C  predniSONE (DELTASONE) 20 MG tablet Take 2 tablets (40 mg total) by mouth daily. For 5 days Patient not taking: Reported on 06/01/2015 11/10/14   Pauline Aus, PA-C    Allergies Patient has no known allergies.  No family history on file.  Social History Social History  Substance Use Topics  . Smoking status: Never Smoker  . Smokeless  tobacco: Never Used  . Alcohol use Yes     Comment: occ    Review of Systems Constitutional: No fever/chills Eyes: No visual changes. ENT: No sore throat. No stiff neck no neck pain Cardiovascular: Denies chest pain. Respiratory: Denies shortness of breath. Gastrointestinal:   see history of present illness. Genitourinary: Negative for dysuria. Musculoskeletal: Negative lower extremity swelling Skin: Negative for rash. Neurological: Negative for severe headaches, focal weakness or numbness.   ____________________________________________   PHYSICAL EXAM:  VITAL SIGNS: ED Triage Vitals  Enc Vitals Group     BP 07/20/17 2101 (!) 176/101     Pulse Rate 07/20/17 2101 80     Resp 07/20/17 2101 16     Temp 07/20/17 2101 97.7 F (36.5 C)     Temp src --      SpO2 07/20/17 2101 97 %     Weight 07/20/17 2057 230 lb (104.3 kg)     Height 07/20/17 2057  (1.93 m)     Head Circumference --      Peak Flow --      Pain Score 07/20/17 2056 8     Pain Loc --      Pain Edu? --      Excl. in GC? --     Constitutional: Alert and oriented. Well appearing and in no acute distress. Eyes: Conjunctivae are normal Head: Atraumatic HEENT: No congestion/rhinnorhea. Mucous membranes are moist.  Oropharynx non-erythematous Neck:   Nontender with no meningismus, no masses, no stridor Cardiovascular: Normal rate, regular rhythm. Grossly normal  heart sounds.  Good peripheral circulation. Respiratory: Normal respiratory effort.  No retractions. Lungs CTAB. Abdominal: diffusely tender in bilateral lower abdominal regions as well as epigastric No distention. Voluntary guarding no rebound Back:  There is no focal tenderness or step off.  there is no midline tenderness there are no lesions noted. there is no CVA tenderness GU, no testicular pain or swelling Musculoskeletal: No lower extremity tenderness, no upper extremity tenderness. No joint effusions, no DVT signs strong distal pulses no  edema Neurologic:  Normal speech and language. No gross focal neurologic deficits are appreciated.  Skin:  Skin is warm, dry and intact. No rash noted. Psychiatric: Mood and affect are normal. Speech and behavior are normal.  ____________________________________________   LABS (all labs ordered are listed, but only abnormal results are displayed)  Labs Reviewed  BASIC METABOLIC PANEL - Abnormal; Notable for the following:       Result Value   Glucose, Bld 101 (*)    All other components within normal limits  CBC  TROPONIN I  LIPASE, BLOOD  HEPATIC FUNCTION PANEL  URINALYSIS, COMPLETE (UACMP) WITH MICROSCOPIC  URINE DRUG SCREEN, QUALITATIVE ([facility] ONLY)    Pertinent labs  results that were available during my care of the patient were reviewed by me and considered in my medical decision making (see chart for details). ____________________________________________  EKG  I personally interpreted any EKGs ordered by me or triage EKG shows a partialright bundle-branch block,rate 85 bpm, no acute ST elevation or depression, changes associated with bundle branch block noted. No old for comparison. ____________________________________________  RADIOLOGY  Pertinent labs & imaging results that were available during my care of the patient were reviewed by me and considered in my medical decision making (see chart for details). If possible, patient and/or family made aware of any abnormal findings. ____________________________________________    PROCEDURES  Procedure(s) performed: None  Procedures  Critical Care performed: None  ____________________________________________   INITIAL IMPRESSION / ASSESSMENT AND PLAN / ED COURSE  Pertinent labs & imaging results that were available during my care of the patient were reviewed by me and considered in my medical decision making (see chart for details).  patient here for nausea vomiting and loose stools with diffuse abdominal pain.  He is somewhat tender, does have some right lower quadrant tenderness she also has left lower quadrant tenderness. Most likely this is a viral pathology however, we will obtain a CT scan to rule out other diverticulitis appendicitis. He does not seem to have a great of right upper quadrant pain but actually his pain is pretty diffuse in the abdomen so difficulty clinically to have where he is tender. He himself cannot tell. I do not think a pleasant see his pain is x-ray other intrathoracic pathology, patient is very reproducible abdominal pain there's nausea vomiting diarrhea with negative troponin despite 2 days of symptoms. I will also send a hepatic panel.t ----------------------------------------- 3:42 AM on 07/21/2017 -----------------------------------------   results all very reassuring, blood work, CT scan, cardiac enzymes, etc. Patient Symptoms are for most consistent with gastroenteritis most likely viral.Considering the patient's symptoms, medical history, and physical examination today, I have low suspicion for cholecystitis or biliary pathology, pancreatitis, perforation or bowel obstruction, hernia, intra-abdominal abscess, AAA or dissection, volvulus or intussusception, mesenteric ischemia, ischemic gut, pyelonephritis or appendicitis.At this time, there does not appear to be clinical evidence to support the diagnosis of pulmonary embolus, dissection, myocarditis, endocarditis, pericarditis, pericardial tamponade, acute coronary syndrome, pneumothorax, pneumonia, or any other acute  intrathoracic pathology that will require admission or acute intervention. Nor is there evidence of any significant intra-abdominal pathology causing this discomfort.   ____________________________________________   FINAL CLINICAL IMPRESSION(S) / ED DIAGNOSES  Final diagnoses:  None      This chart was dictated using voice recognition software.  Despite best efforts to proofread,  errors can occur which  can change meaning.      Jeanmarie Plant, MD 07/21/17 0104    Jeanmarie Plant, MD 07/21/17 1610    Jeanmarie Plant, MD 07/21/17 [phone]

## 2017-07-22 LAB — URINE CULTURE: CULTURE: NO GROWTH

## 2017-07-24 ENCOUNTER — Emergency Department
Admission: EM | Admit: 2017-07-24 | Discharge: 2017-07-24 | Disposition: A | Discharge status: Home / Self Care | Payer: Self-pay | Attending: Emergency Medicine | Admitting: Emergency Medicine

## 2017-07-24 DIAGNOSIS — R197 Diarrhea, unspecified: Secondary | ICD-10-CM | POA: Insufficient documentation

## 2017-07-24 DIAGNOSIS — R1084 Generalized abdominal pain: Secondary | ICD-10-CM | POA: Insufficient documentation

## 2017-07-24 LAB — COMPREHENSIVE METABOLIC PANEL
ALT: 30 U/L (ref 17–63)
AST: 23 U/L (ref 15–41)
Albumin: 4.4 g/dL (ref 3.5–5.0)
Alkaline Phosphatase: 57 U/L (ref 38–126)
Anion gap: 10 (ref 5–15)
BILIRUBIN TOTAL: 2.6 mg/dL — AB (ref 0.3–1.2)
BUN: 16 mg/dL (ref 6–20)
CALCIUM: 9.3 mg/dL (ref 8.9–10.3)
CO2: 24 mmol/L (ref 22–32)
CREATININE: 0.94 mg/dL (ref 0.61–1.24)
Chloride: 104 mmol/L (ref 101–111)
GFR calc Af Amer: >60 mL/min (ref >60)
Glucose, Bld: 103 mg/dL — ABNORMAL HIGH (ref 65–99)
Potassium: 4.1 mmol/L (ref 3.5–5.1)
Sodium: 138 mmol/L (ref 135–145)
TOTAL PROTEIN: 8 g/dL (ref 6.5–8.1)

## 2017-07-24 LAB — CBC
HCT: 46.9 % (ref 40.0–52.0)
Hemoglobin: 15.1 g/dL (ref 13.0–18.0)
MCH: 27.5 pg (ref 26.0–34.0)
MCHC: 32.2 g/dL (ref 32.0–36.0)
MCV: 85.4 fL (ref 80.0–100.0)
PLATELETS: 178 10*3/uL (ref 150–440)
RBC: 5.49 MIL/uL (ref 4.40–5.90)
RDW: 12.6 % (ref 11.5–14.5)
WBC: 4.1 10*3/uL (ref 3.8–10.6)

## 2017-07-24 LAB — LIPASE, BLOOD: Lipase: 21 U/L (ref 11–51)

## 2017-07-24 MED ORDER — ONDANSETRON 4 MG PO TBDP
4.0000 mg | ORAL_TABLET | Freq: Once | ORAL | Status: AC | PRN
  Administered 2017-07-24: 4 mg via ORAL
  Filled 2017-07-24: qty 1

## 2017-07-24 MED ORDER — IBUPROFEN 400 MG PO TABS
600.0000 mg | ORAL_TABLET | Freq: Once | ORAL | Status: AC
  Administered 2017-07-24: 600 mg via ORAL
  Filled 2017-07-24: qty 2

## 2017-07-24 NOTE — ED Notes (Signed)
Patient made aware of need of urine sample. States he is unable to void at this time. 

## 2017-07-24 NOTE — ED Provider Notes (Signed)
[facility] Emergency Department Provider Note  ____________________________________________  Time seen: Approximately 2:21 PM  I have reviewed the triage vital signs and the nursing notes.   HISTORY  Chief Complaint Abdominal Pain   HPI Raymond Mcintyre is a 41 y.o. male no significant past medical history who presents for evaluation of abdominal pain, nausea, and diarrhea. Patient reports diffuse cramping intermittent abdominal pain for the last 5 days associated with several daily episodes of watery diarrhea and nausea. He is no longer vomiting. No hematemesis, no melena, no bright red blood per rectum. No fever, no chills, no chest pain or shortness of breath, no dysuria or hematuria. Currently his pain is 6 out of 10. He hasn't taken anything at home for the pain. He is able to eat and drink. no recent antibiotic use or history of C. difficile. Only having 3-4 episodes of diarrhea today.   History reviewed. No pertinent past medical history.  There are no active problems to display for this patient.   History reviewed. No pertinent surgical history.  Prior to Admission medications   Medication Sig Start Date End Date Taking? Authorizing Provider  acetaminophen (TYLENOL) 500 MG tablet Take 1,000 mg by mouth every 6 (six) hours as needed for mild pain.    [provider]  clindamycin (CLEOCIN) 300 MG capsule Take 1 capsule (300 mg total) by mouth every 6 (six) hours. Patient not taking: Reported on 07/23/2015 06/01/15   Teressa LowerPickering, Vrinda, NP  doxycycline (VIBRAMYCIN) 100 MG capsule Take 1 capsule (100 mg total) by mouth 2 (two) times daily. 07/21/15   Cathren LaineSteinl, Kevin, MD  HYDROcodone-acetaminophen (NORCO/VICODIN) 5-325 MG tablet Take 1 tablet by mouth every 4 (four) hours as needed. 07/23/15   Burgess AmorIdol, Julie, PA-C  ondansetron (ZOFRAN) 4 MG tablet Take 1 tablet (4 mg total) by mouth daily as needed for nausea or vomiting. 07/21/17   Jeanmarie PlantMcShane,  James A, MD  predniSONE (DELTASONE) 20 MG tablet Take 2 tablets (40 mg total) by mouth daily. For 5 days Patient not taking: Reported on 06/01/2015 11/10/14   Pauline Ausriplett, Tammy, PA-C    Allergies Patient has no known allergies.  No family history on file.  Social History Social History  Substance Use Topics  . Smoking status: Never Smoker  . Smokeless tobacco: Never Used  . Alcohol use Yes     Comment: occ    Review of Systems  Constitutional: Negative for fever. Eyes: Negative for visual changes. ENT: Negative for sore throat. Neck: No neck pain  Cardiovascular: Negative for chest pain. Respiratory: Negative for shortness of breath. Gastrointestinal: + abdominal pain, nausea, and diarrhea. No vomiting Genitourinary: Negative for dysuria. Musculoskeletal: Negative for back pain. Skin: Negative for rash. Neurological: Negative for headaches, weakness or numbness. Psych: No SI or HI  ____________________________________________   PHYSICAL EXAM:  VITAL SIGNS: ED Triage Vitals [07/24/17 1147]  Enc Vitals Group     BP (!) 154/89     Pulse Rate 84     Resp 18     Temp 98.7 F (37.1 C)     Temp Source Oral     SpO2 100 %     Weight 230 lb (104.3 kg)     Height 6\' 4"  (1.93 m)     Head Circumference      Peak Flow      Pain Score 8     Pain Loc      Pain Edu?      Excl. in GC?  Constitutional: Alert and oriented. Well appearing and in no apparent distress. HEENT:      Head: Normocephalic and atraumatic.         Eyes: Conjunctivae are normal. Sclera is non-icteric.       Mouth/Throat: Mucous membranes are moist.       Neck: Supple with no signs of meningismus. Cardiovascular: Regular rate and rhythm. No murmurs, gallops, or rubs. 2+ symmetrical distal pulses are present in all extremities. No JVD. Respiratory: Normal respiratory effort. Lungs are clear to auscultation bilaterally. No wheezes, crackles, or rhonchi.  Gastrointestinal: Soft, mildly diffuse tender  to palpation, and non distended with positive bowel sounds. No rebound or guarding. Genitourinary: No CVA tenderness. Musculoskeletal: Nontender with normal range of motion in all extremities. No edema, cyanosis, or erythema of extremities. Neurologic: Normal speech and language. Face is symmetric. Moving all extremities. No gross focal neurologic deficits are appreciated. Skin: Skin is warm, dry and intact. No rash noted. Psychiatric: Mood and affect are normal. Speech and behavior are normal.  ____________________________________________   LABS (all labs ordered are listed, but only abnormal results are displayed)  Labs Reviewed  COMPREHENSIVE METABOLIC PANEL - Abnormal; Notable for the following:       Result Value   Glucose, Bld 103 (*)    Total Bilirubin 2.6 (*)    All other components within normal limits  LIPASE, BLOOD  CBC   ____________________________________________  EKG  none  ____________________________________________  RADIOLOGY  none  ____________________________________________   PROCEDURES  Procedure(s) performed: None Procedures Critical Care performed:  None ____________________________________________   INITIAL IMPRESSION / ASSESSMENT AND PLAN / ED COURSE  41 y.o. male no significant past medical history who presents for evaluation of abdominal pain, nausea, and diarrhea. patient seen here for the same complaint 3 days ago with a CT scan that was negative and normal blood work. Patient looks extremely well appearing, no distress, vital signs are within normal limits, abdomen is soft and mildly diffuse to palpation with no localized tenderness, rebound or guarding. Presentation concerning for gastroenteritis versus colitis. The patient looks well-hydrated.labs show a normal white count, normal CMP and lipase. Do not feel the patient will benefit from repeat imaging at this time with a negative CT scan 3 days ago, reassuring exam, normal vitals and  normal labs.patient was given a prescription for Zofran 3 days ago. We'll discharge home on supportive care and close follow-up with primary care doctor. Discussed return precautions.      As part of my medical decision making, I reviewed the following data within the electronic MEDICAL RECORD NUMBER Nursing notes reviewed and incorporated, Labs reviewed , Old chart reviewed, Radiograph reviewed , Notes from prior ED visits and  Controlled Substance Database    Pertinent labs & imaging results that were available during my care of the patient were reviewed by me and considered in my medical decision making (see chart for details).    ____________________________________________   FINAL CLINICAL IMPRESSION(S) / ED DIAGNOSES  Final diagnoses:  Generalized abdominal pain  Diarrhea of presumed infectious origin      NEW MEDICATIONS STARTED DURING THIS VISIT:  Discharge Medication List as of 07/24/2017  2:18 PM       Note:  This document was prepared using Dragon voice recognition software and may include unintentional dictation errors.    Nita Sickle, MD 07/24/17 [phone]

## 2017-07-24 NOTE — ED Triage Notes (Signed)
Pt reports that he had has abdominal pain for approx a week.  Pt states that he was seen here on Sunday night for the same and was told that if pain continued to come back.  Pt states he was referred to a GI doctor, but has not followed up at this time.  Pt A&Ox4, in NAD, ambulatory to triage.

## 2017-07-24 NOTE — ED Notes (Signed)
Electronic signature pad not working at this time. Patient given discharge instructions, including follow up. Patient verbalizes understanding.

## 2017-07-24 NOTE — Discharge Instructions (Signed)

## 2017-12-04 ENCOUNTER — Other Ambulatory Visit: Payer: Self-pay

## 2017-12-04 ENCOUNTER — Emergency Department: Payer: Self-pay

## 2017-12-04 ENCOUNTER — Emergency Department
Admission: EM | Admit: 2017-12-04 | Discharge: 2017-12-04 | Disposition: A | Discharge status: Home / Self Care | Payer: Self-pay | Attending: Emergency Medicine | Admitting: Emergency Medicine

## 2017-12-04 ENCOUNTER — Encounter: Payer: Self-pay | Admitting: Emergency Medicine

## 2017-12-04 DIAGNOSIS — R079 Chest pain, unspecified: Secondary | ICD-10-CM | POA: Insufficient documentation

## 2017-12-04 LAB — TROPONIN I: Troponin I: <0.03 ng/mL (ref <0.03)

## 2017-12-04 LAB — CBC
HEMATOCRIT: 47.1 % (ref 40.0–52.0)
Hemoglobin: 15.3 g/dL (ref 13.0–18.0)
MCH: 27.8 pg (ref 26.0–34.0)
MCHC: 32.4 g/dL (ref 32.0–36.0)
MCV: 85.8 fL (ref 80.0–100.0)
PLATELETS: 187 10*3/uL (ref 150–440)
RBC: 5.49 MIL/uL (ref 4.40–5.90)
RDW: 12.7 % (ref 11.5–14.5)
WBC: 4.4 10*3/uL (ref 3.8–10.6)

## 2017-12-04 LAB — BASIC METABOLIC PANEL
Anion gap: 7 (ref 5–15)
BUN: 11 mg/dL (ref 6–20)
CHLORIDE: 103 mmol/L (ref 101–111)
CO2: 30 mmol/L (ref 22–32)
Calcium: 9.2 mg/dL (ref 8.9–10.3)
Creatinine, Ser: 0.99 mg/dL (ref 0.61–1.24)
Glucose, Bld: 92 mg/dL (ref 65–99)
POTASSIUM: 4.1 mmol/L (ref 3.5–5.1)
SODIUM: 140 mmol/L (ref 135–145)

## 2017-12-04 MED ORDER — IBUPROFEN 800 MG PO TABS: 800.0000 mg | ORAL_TABLET | Freq: Three times a day (TID) | ORAL | 0 refills | Status: DC | PRN

## 2017-12-04 NOTE — ED Provider Notes (Signed)
[facility] Emergency Department Provider Note       Time seen: ----------------------------------------- 1:34 PM on 12/04/2017 -----------------------------------------   I have reviewed the triage vital signs and the nursing notes.  HISTORY   Chief Complaint Chest Pain and Shortness of Breath    HPI Raymond Mcintyre is a 42 y.o. male with no significant past medical history who presents to the ED for mid chest pain that began this morning and shortness of breath.  He describes this chest pain is central and sharp.  It seems to increase with a deep breath.  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  Pain is currently mild at this time.  History reviewed. No pertinent past medical history.  There are no active problems to display for this patient.   History reviewed. No pertinent surgical history.  Allergies Patient has no known allergies.  Social History Social History   Tobacco Use  . Smoking status: Never Smoker  . Smokeless tobacco: Never Used  Substance Use Topics  . Alcohol use: Yes    Comment: occ  . Drug use: No    Review of Systems Constitutional: Negative for fever. Cardiovascular: Positive for chest pain Respiratory: Negative for shortness of breath. Gastrointestinal: Negative for abdominal pain, vomiting and diarrhea. Musculoskeletal: Negative for back pain. Skin: Negative for rash. Neurological: Negative for headaches, focal weakness or numbness.  All systems negative/normal/unremarkable except as stated in the HPI  ____________________________________________   PHYSICAL EXAM:  VITAL SIGNS: ED Triage Vitals  Enc Vitals Group     BP 12/04/17 1033 (!) 160/92     Pulse Rate 12/04/17 1033 84     Resp 12/04/17 1302 17     Temp 12/04/17 1033 98.3 F (36.8 C)     Temp Source 12/04/17 1033 Oral     SpO2 12/04/17 1033 100 %     Weight 12/04/17 1034 240 lb  (108.9 kg)     Height 12/04/17 1034 6\' 4"  (1.93 m)     Head Circumference --      Peak Flow --      Pain Score 12/04/17 1056 8     Pain Loc --      Pain Edu? --      Excl. in GC? --     Constitutional: Alert and oriented. Well appearing and in no distress. Eyes: Conjunctivae are normal. Normal extraocular movements. ENT   Head: Normocephalic and atraumatic.   Nose: No congestion/rhinnorhea.   Mouth/Throat: Mucous membranes are moist.   Neck: No stridor. Cardiovascular: Normal rate, regular rhythm. No murmurs, rubs, or gallops. Respiratory: Normal respiratory effort without tachypnea nor retractions. Breath sounds are clear and equal bilaterally. No wheezes/rales/rhonchi. Gastrointestinal: Soft and nontender. Normal bowel sounds Musculoskeletal: Nontender with normal range of motion in extremities. No lower extremity tenderness nor edema. Neurologic:  Normal speech and language. No gross focal neurologic deficits are appreciated.  Skin:  Skin is warm, dry and intact. No rash noted. Psychiatric: Mood and affect are normal. Speech and behavior are normal.  ____________________________________________  EKG: Interpreted by me.  Sinus rhythm with a rate of 82 bpm, incomplete right bundle branch block, normal QRS, normal QT.  ____________________________________________  ED COURSE:  As part of my medical decision making, I reviewed the following data within the electronic MEDICAL RECORD NUMBER History obtained from family if available, nursing notes, old chart and ekg, as well as notes from prior ED visits.  Patient presented for chest pain, we will assess with labs and imaging as indicated at this time.   Procedures ____________________________________________   LABS (pertinent positives/negatives)  Labs Reviewed  BASIC METABOLIC PANEL  CBC  TROPONIN I  TROPONIN I    RADIOLOGY  Chest x-ray is normal  ____________________________________________  DIFFERENTIAL  DIAGNOSIS   Musculoskeletal pain, anxiety, GERD, MI, unstable angina, PE  FINAL ASSESSMENT AND PLAN  Chest pain   Plan: The patient had presented for chest pain and has had a negative workup. Patient's labs are reassuring including repeat troponin. Patient's imaging is also negative.  He is stable for outpatient follow-up, likely musculoskeletal in origin.   Ulice DashJohnathan E Williams, MD   Note: This note was generated in part or whole with voice recognition software. Voice recognition is usually quite accurate but there are transcription errors that can and very often do occur. I apologize for any typographical errors that were not detected and corrected.     Emily FilbertWilliams, Jonathan E, MD 12/04/17 1351

## 2017-12-04 NOTE — ED Notes (Signed)
Left sided chest pain that started yesterday, worse with movement. No SOB, nausea, diaphoresis. Pt alert and oriented X4, active, cooperative, pt in NAD. RR even and unlabored, color WNL.

## 2017-12-04 NOTE — ED Triage Notes (Signed)
Chest pain mid chest and sob since this am.  Raymond Mcintyre.  Increased with deep breath.  No recent illness or cough.

## 2017-12-04 NOTE — ED Notes (Signed)
Pt alert and oriented X4, active, cooperative, pt in NAD. RR even and unlabored, color WNL.  Pt informed to return if any life threatening symptoms occur.  Discharge and followup instructions reviewed.  

## 2018-08-08 IMAGING — CR DG CHEST 2V
1 series · 2 of 2 positions shown · non-contrast
Comparison: 11/29/2009

CLINICAL DATA: Chest pain.

EXAM:
CHEST  2 VIEW

[Series 1: dg chest 2 view · 0.14mm/px · 2 of 2 slices shown]
[im 1/2]
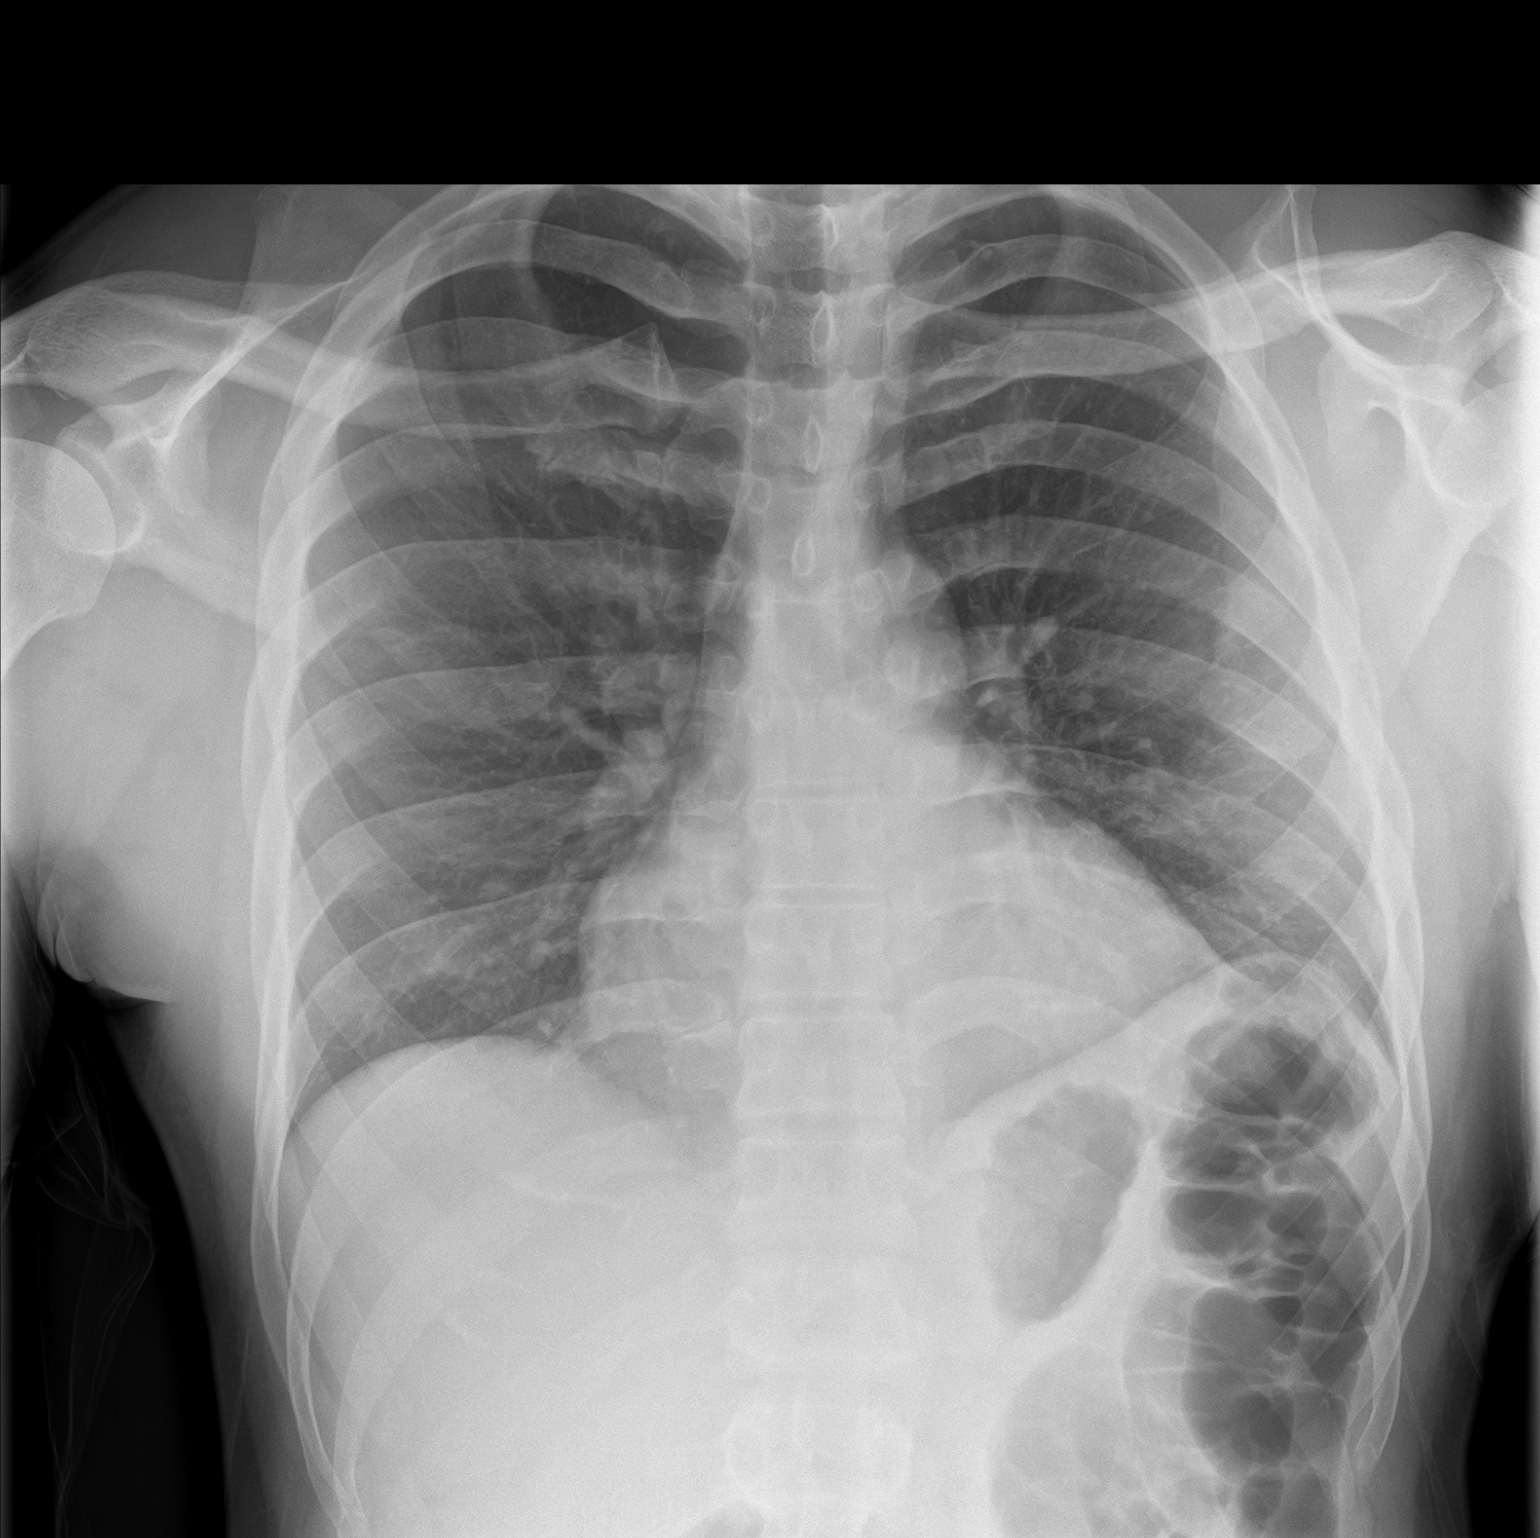
[im 2/2]
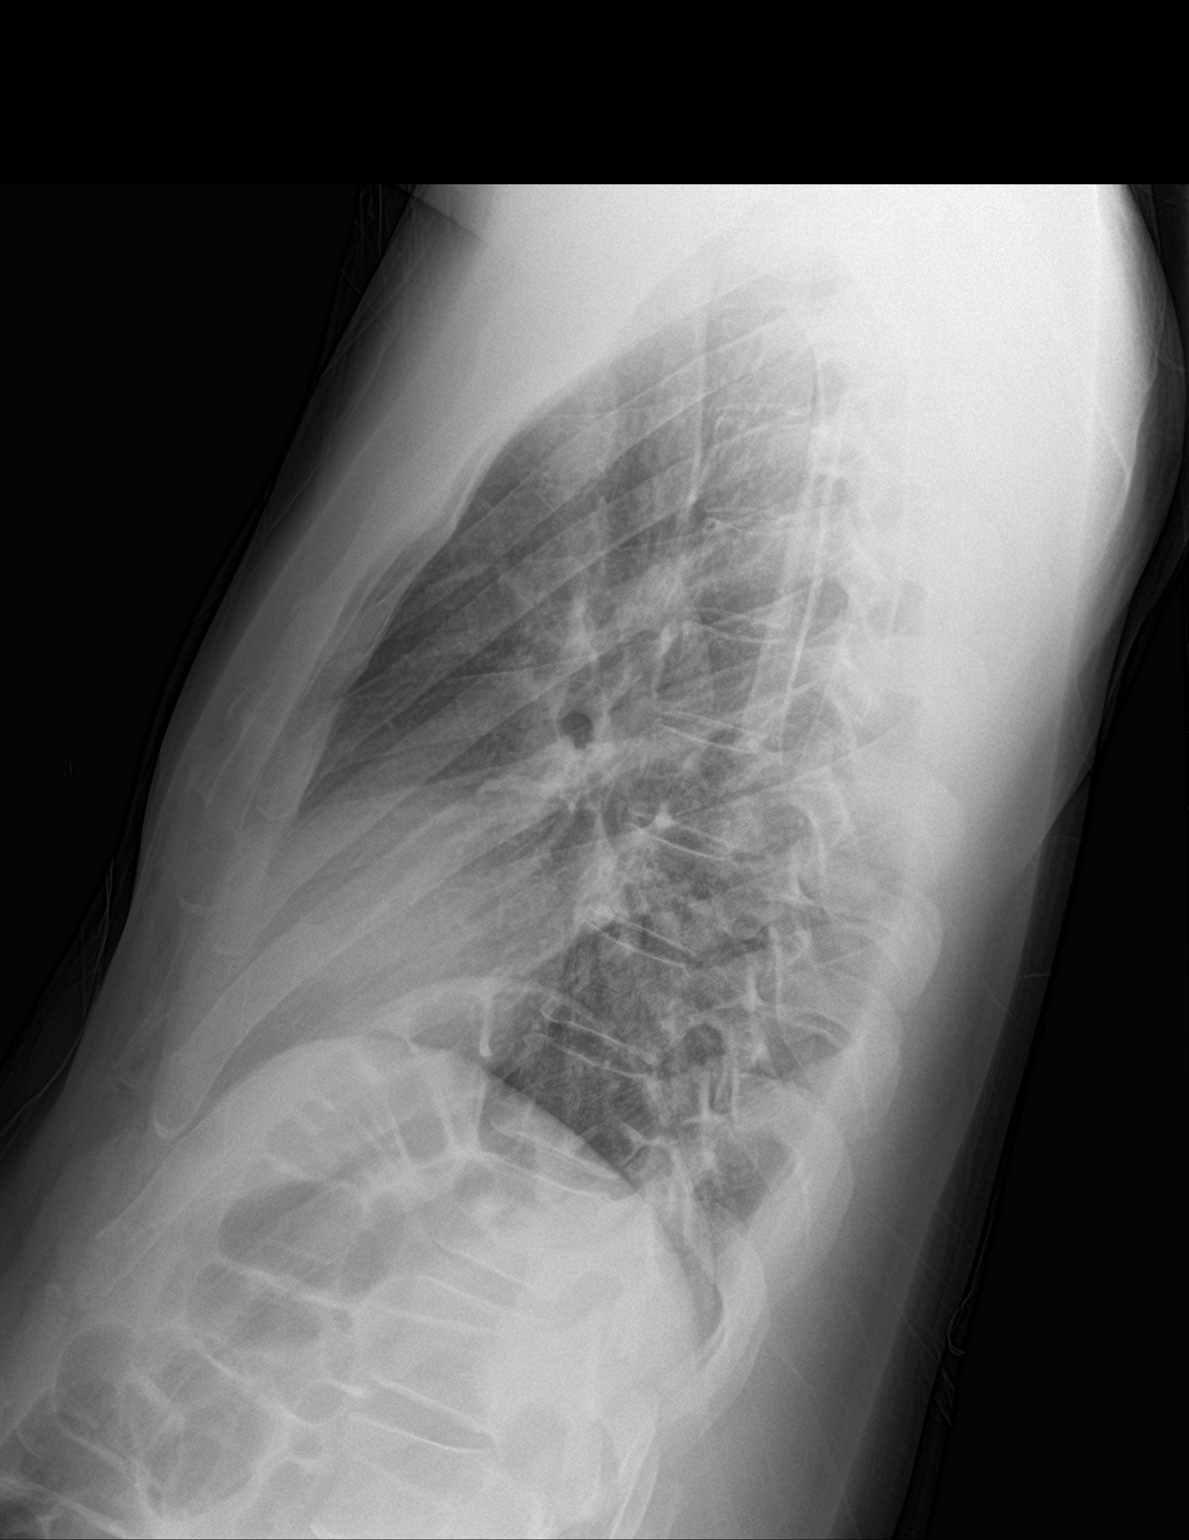

[2 of 2 positions shown; findings below may reference images not displayed]

FINDINGS: Normal cardiac silhouette. There is chronic elevation of the LEFT
hemidiaphragm. No focal infiltrate. There is linear markings at the
lung bases. No focal infiltrate. No pneumothorax
IMPRESSION: 1. Linear marks at lung bases may represent bronchitis. No focal
consolidation.
2. Chronic elevation LEFT hemidiaphragm. Query splenectomy or auto
infarction of the spleen.

## 2018-08-09 IMAGING — CT CT ABD-PELV W/ CM
2 of 4 series · 16 of 46 positions shown, 18 images · IV contrast (iopamidol)
Comparison: None.

CLINICAL DATA: Diffuse abdominal pain for 2 days, progressively
worsening.

EXAM:
CT ABDOMEN AND PELVIS WITH CONTRAST
TECHNIQUE: Multidetector CT imaging of the abdomen and pelvis was performed
using the standard protocol following bolus administration of
intravenous contrast.
CONTRAST:  100mL [id number] IOPAMIDOL ([id number]-[id number]) INJECTION 61%

[Series 2: routine abd/pel with · axial · 0.76mm/px · z∈[-957,-477]mm · 13 of 106 slices shown, 15 images]
[im 5/106  soft-tissue]
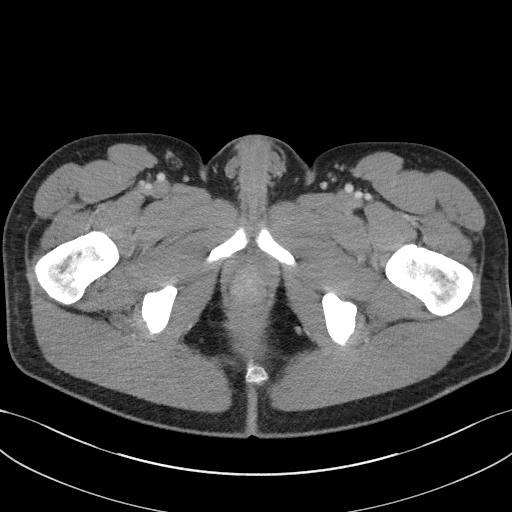
[im 5/106  bone]
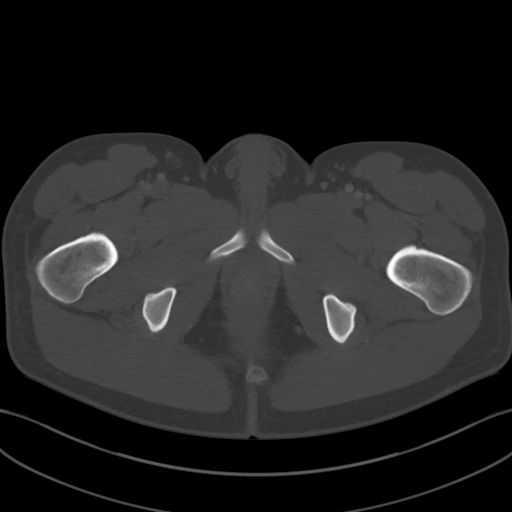
[im 13/106  soft-tissue]
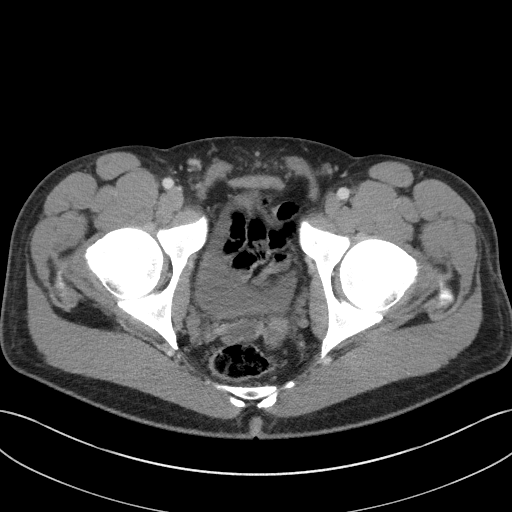
[im 21/106  soft-tissue]
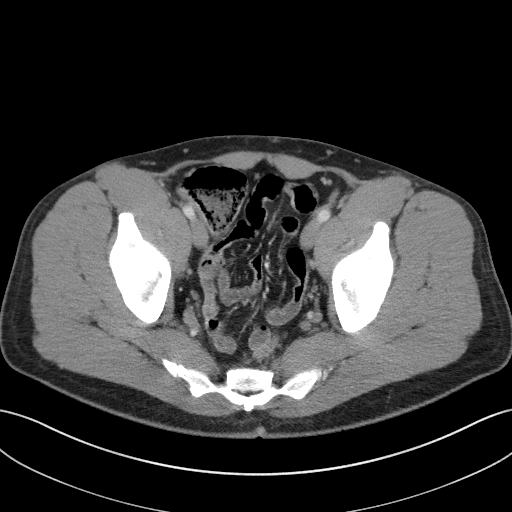
[im 29/106  soft-tissue]
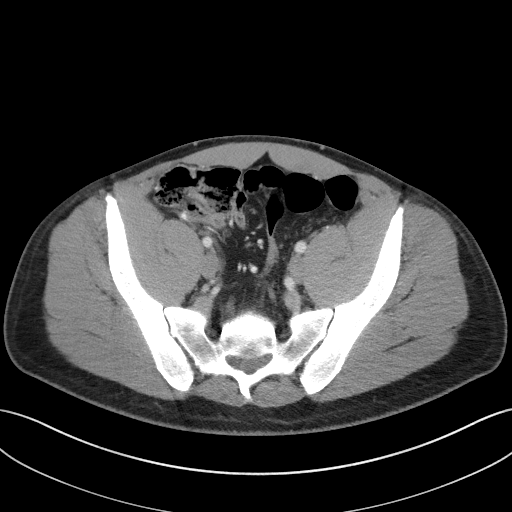
[im 37/106  soft-tissue]
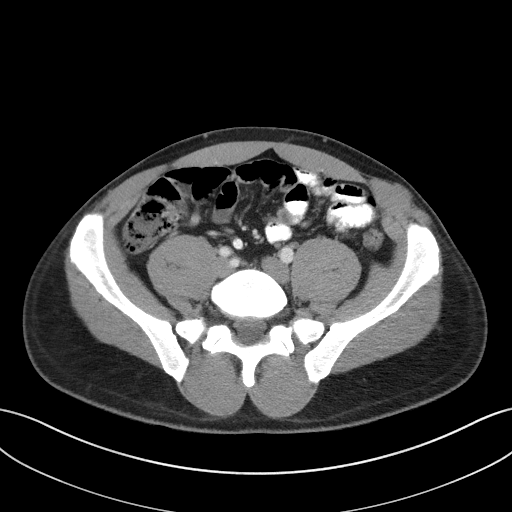
[im 45/106  soft-tissue]
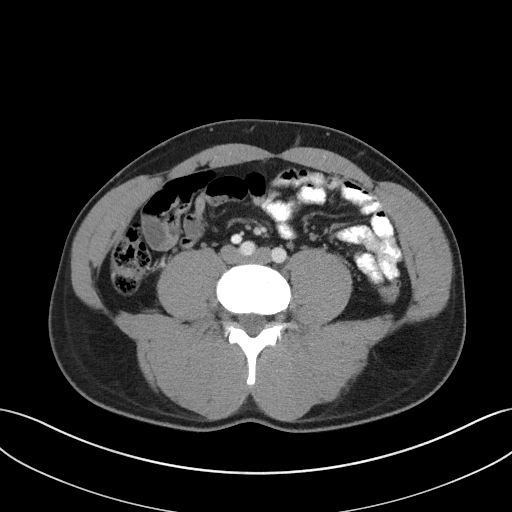
[im 53/106  soft-tissue]
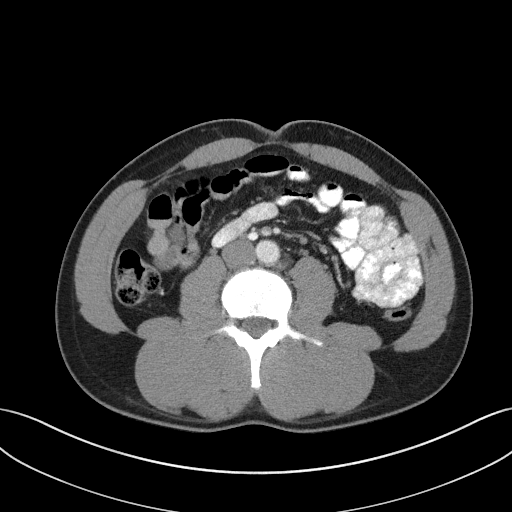
[im 61/106  soft-tissue]
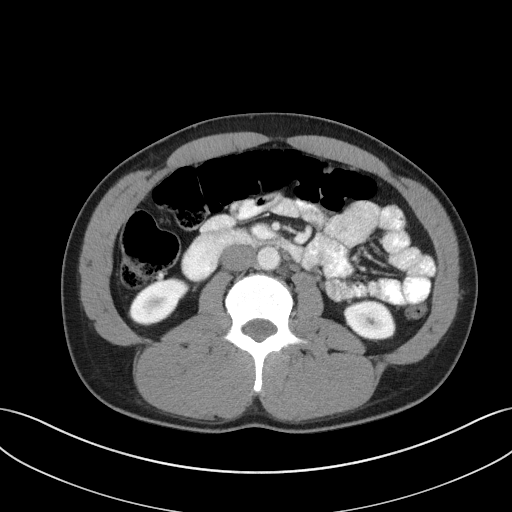
[im 69/106  soft-tissue]
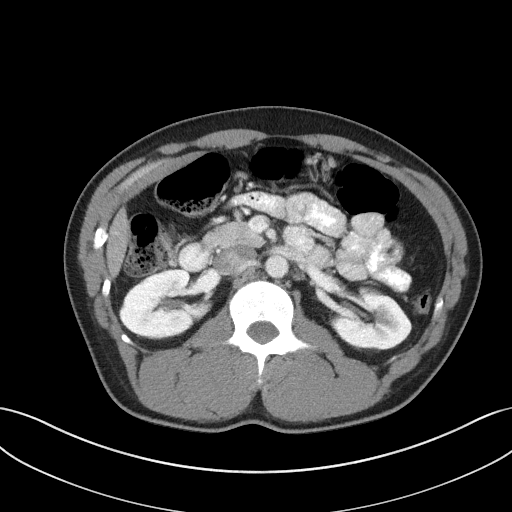
[im 69/106  bone]
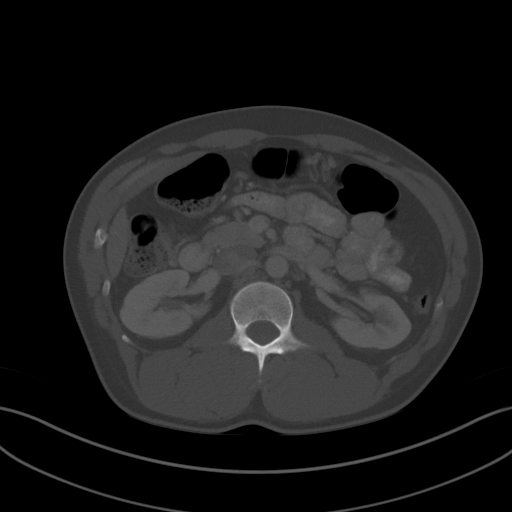
[im 77/106  soft-tissue]
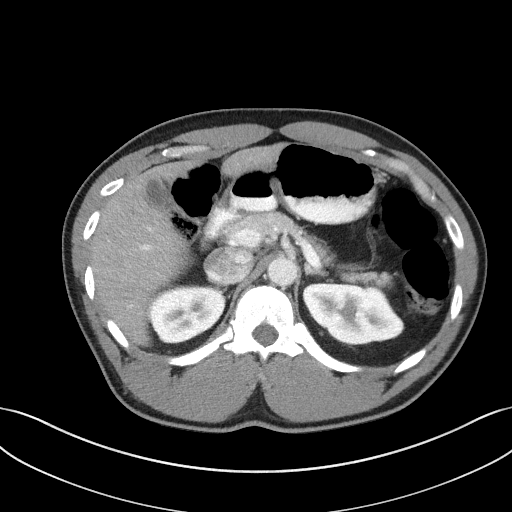
[im 85/106  soft-tissue]
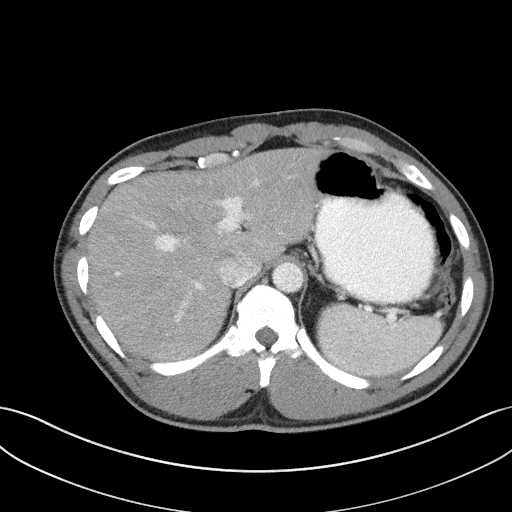
[im 93/106  soft-tissue]
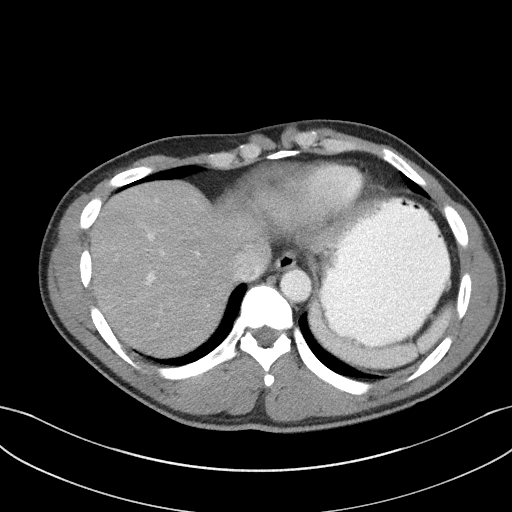
[im 101/106  soft-tissue]
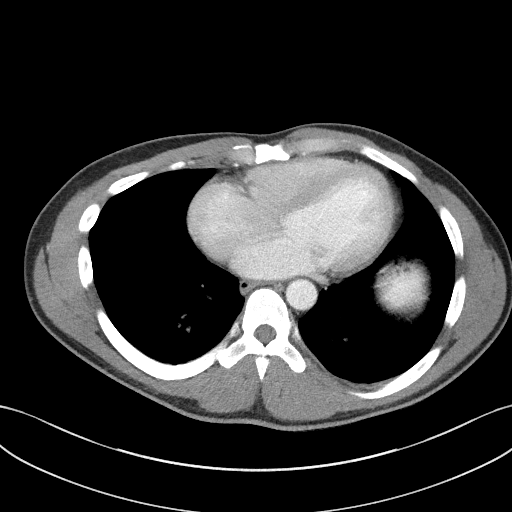

[Series 5: coronal st · coronal · 0.75mm/px · 3 of 81 slices shown]
[im 27/81  soft-tissue]
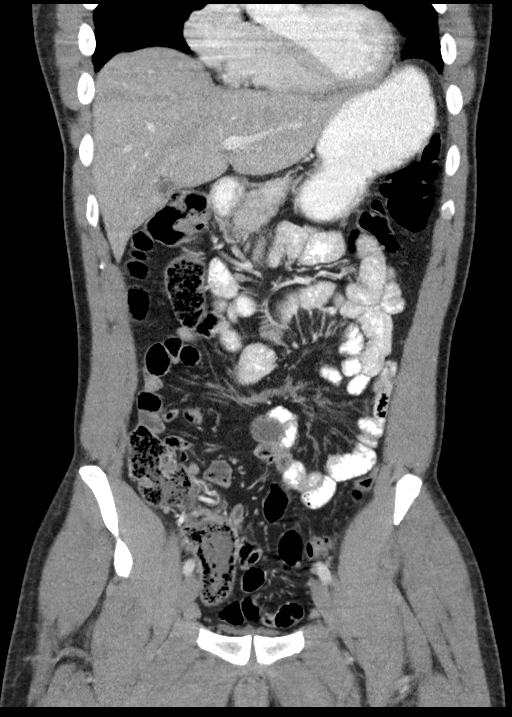
[im 36/81  soft-tissue]
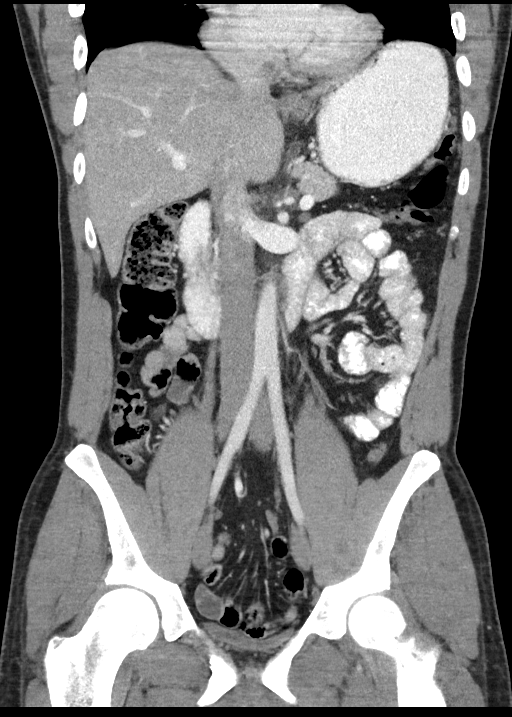
[im 45/81  soft-tissue]
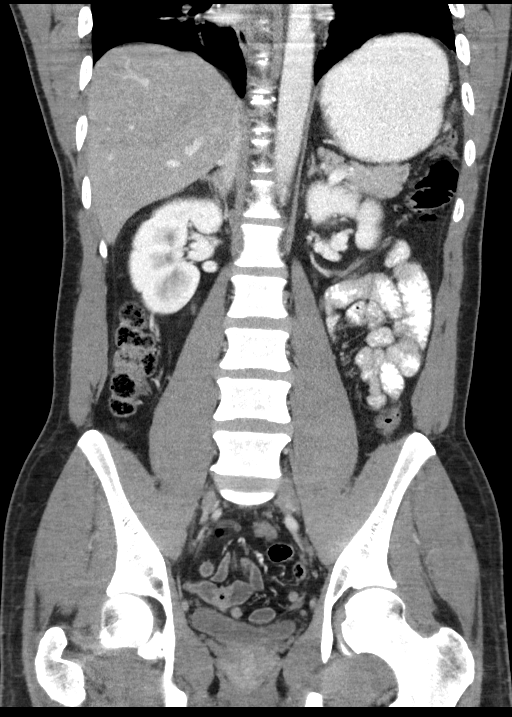

[16 of 46 positions shown; findings below may reference images not displayed]

FINDINGS: Lower chest: No acute abnormality. Mild gynecomastia, incompletely
imaged.

Hepatobiliary: No focal liver abnormality is seen. No gallstones,
gallbladder wall thickening, or biliary dilatation.

Pancreas: Unremarkable. No pancreatic ductal dilatation or
surrounding inflammatory changes.

Spleen: Normal in size without focal abnormality.

Adrenals/Urinary Tract: Adrenal glands are unremarkable. Kidneys are
normal, without renal calculi, focal lesion, or hydronephrosis.
Bladder is unremarkable.

Stomach/Bowel: Stomach is within normal limits. Appendix appears
normal. No evidence of bowel wall thickening, distention, or
inflammatory changes.

Vascular/Lymphatic: No significant vascular findings are present. No
enlarged abdominal or pelvic lymph nodes.

Reproductive: Unremarkable

Other: No focal inflammation.  No ascites.

Musculoskeletal: No significant skeletal lesion.
IMPRESSION: No significant abnormality.

## 2018-12-23 IMAGING — CR DG CHEST 2V
1 series · 2 of 2 positions shown · non-contrast
Comparison: PA and lateral chest x-ray July 20, 2017

CLINICAL DATA: Two days of shortness of breath and sharp mid chest
pain, headache. Nonsmoker.

EXAM:
CHEST  2 VIEW

[Series 1: w chest pa · 0.14mm/px · 2 of 2 slices shown]
[im 1/2]
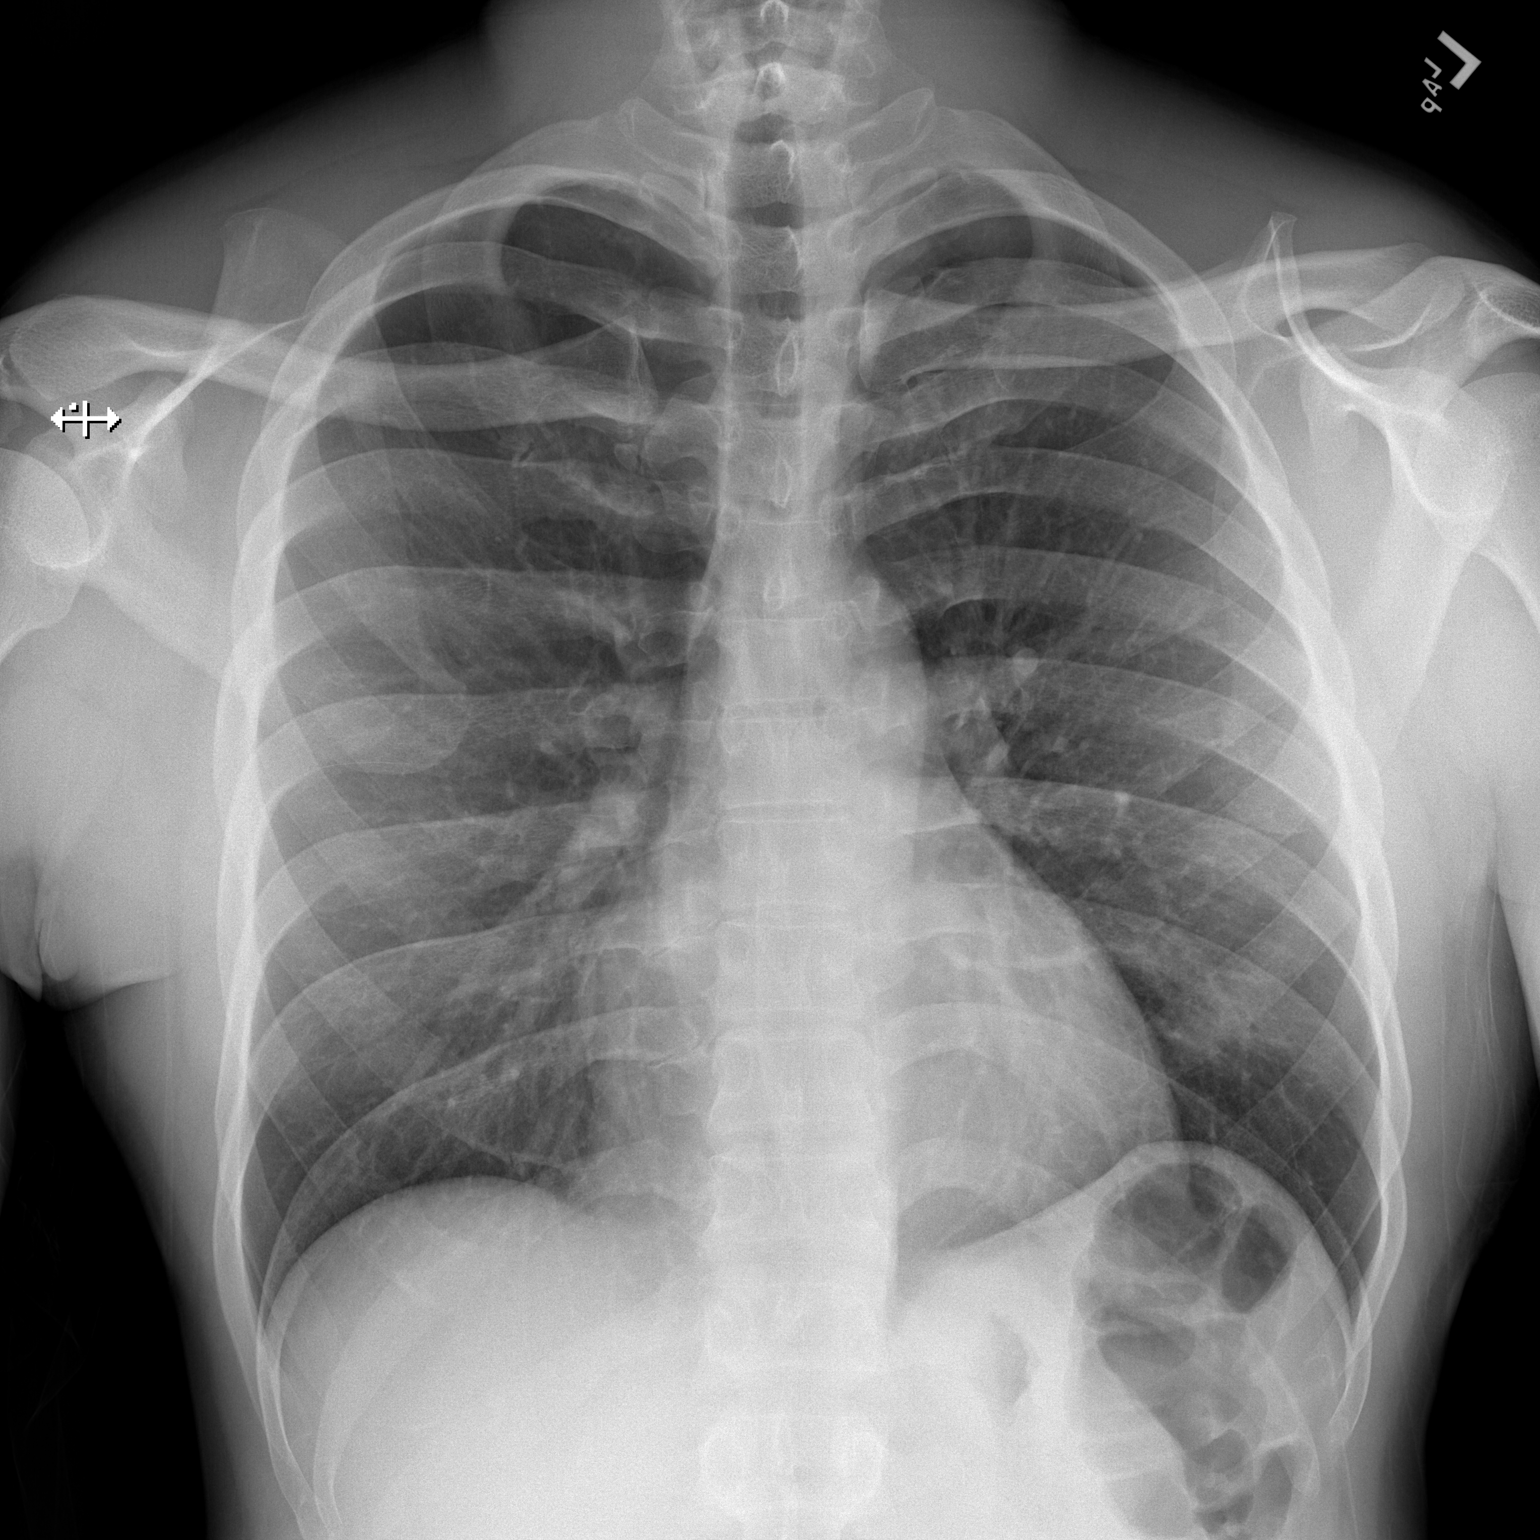
[im 2/2]
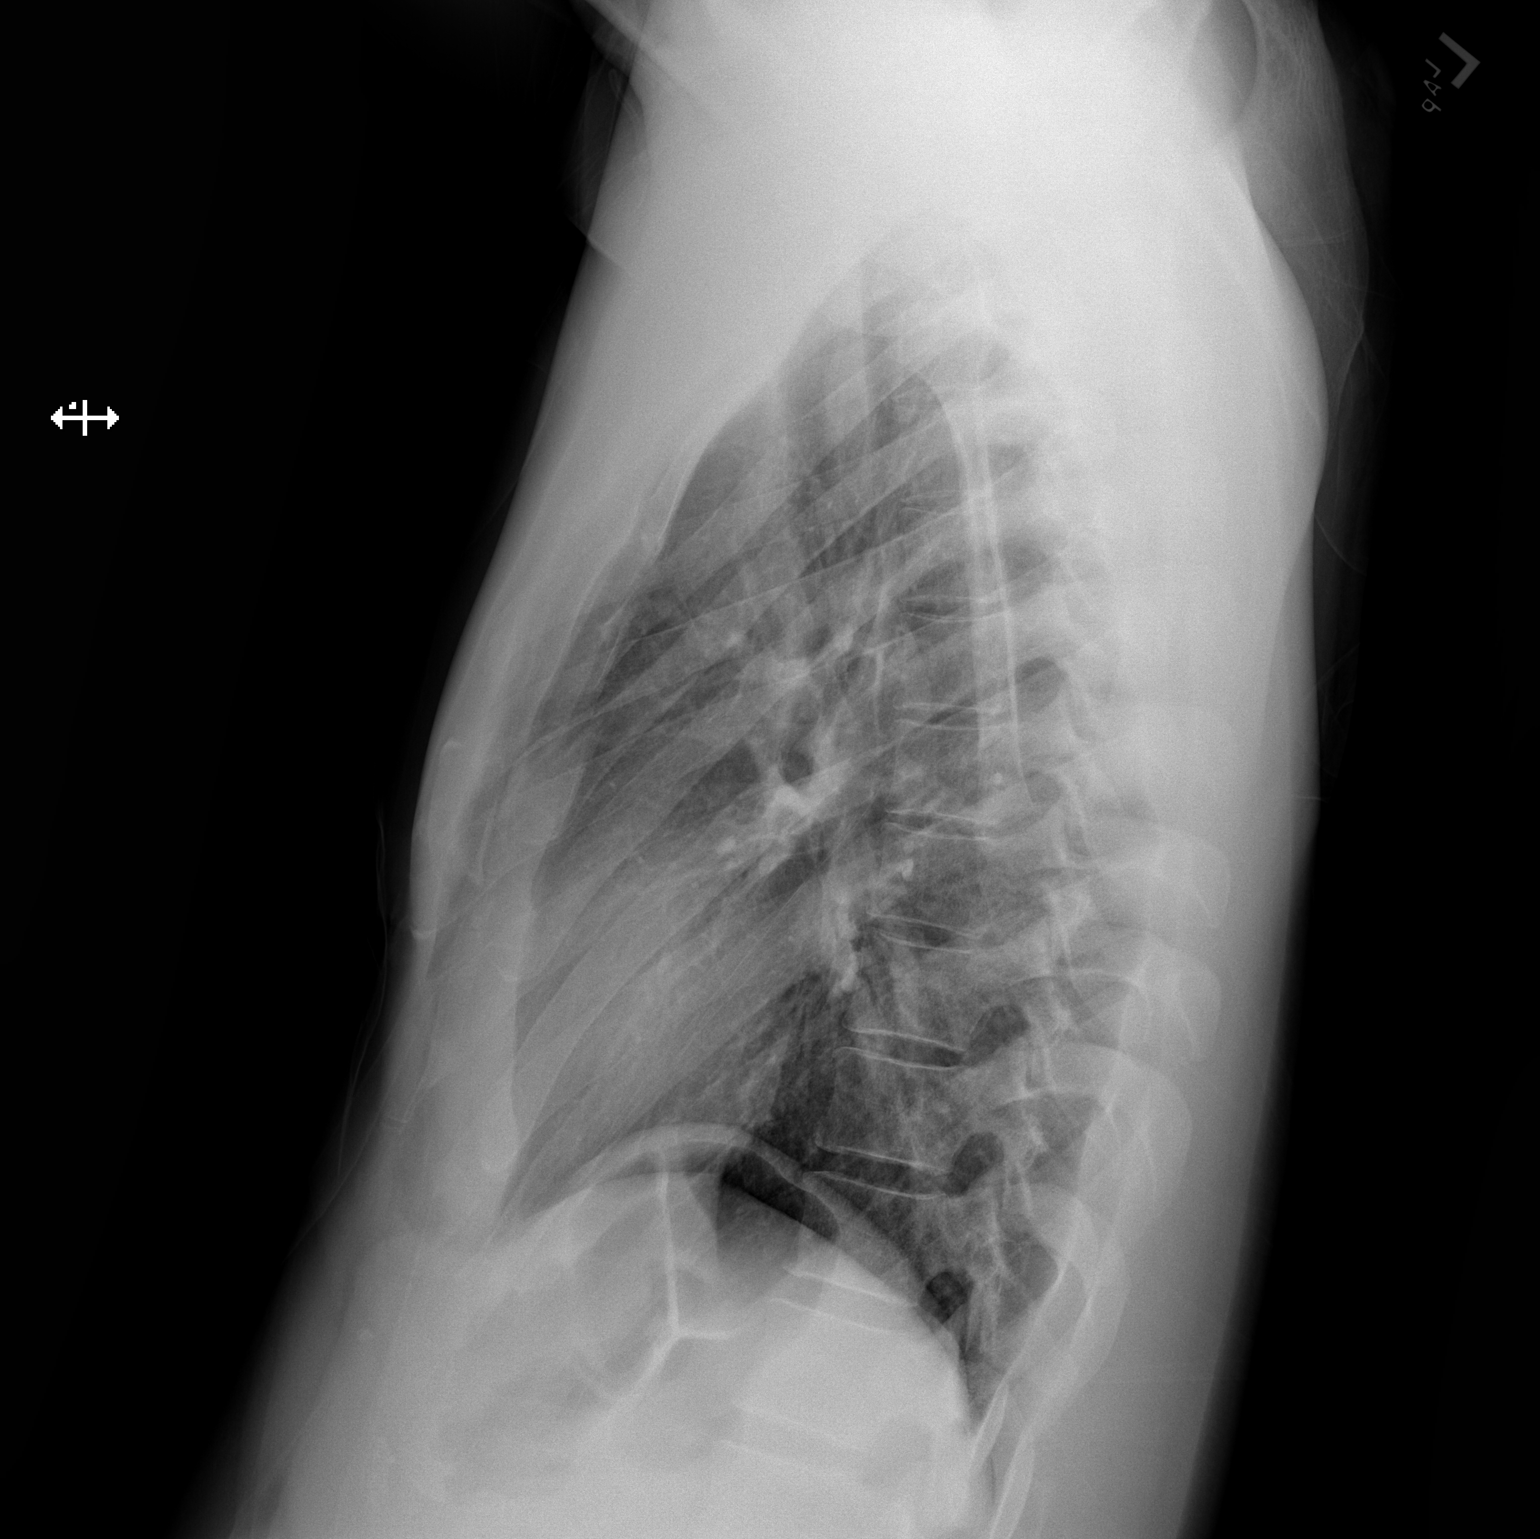

[2 of 2 positions shown; findings below may reference images not displayed]

FINDINGS: The lungs are well-expanded. There is no focal infiltrate. There is
no pleural effusion. The heart and pulmonary vascularity are normal.
The mediastinum is normal in width. The retrosternal soft tissues
are normal. The trachea is midline. The bony thorax exhibits no
acute abnormality.
IMPRESSION: There is no active cardiopulmonary disease.

## 2019-10-26 ENCOUNTER — Emergency Department
Admission: EM | Admit: 2019-10-26 | Discharge: 2019-10-26 | Disposition: A | Discharge status: Home / Self Care | Payer: Medicaid Other | Attending: Emergency Medicine | Admitting: Emergency Medicine

## 2019-10-26 ENCOUNTER — Encounter: Payer: Self-pay | Admitting: Emergency Medicine

## 2019-10-26 ENCOUNTER — Other Ambulatory Visit: Payer: Self-pay

## 2019-10-26 DIAGNOSIS — R1110 Vomiting, unspecified: Secondary | ICD-10-CM | POA: Insufficient documentation

## 2019-10-26 DIAGNOSIS — R519 Headache, unspecified: Secondary | ICD-10-CM

## 2019-10-26 DIAGNOSIS — R1084 Generalized abdominal pain: Secondary | ICD-10-CM

## 2019-10-26 DIAGNOSIS — R197 Diarrhea, unspecified: Secondary | ICD-10-CM | POA: Insufficient documentation

## 2019-10-26 LAB — COMPREHENSIVE METABOLIC PANEL
ALT: 31 U/L (ref 0–44)
AST: 24 U/L (ref 15–41)
Albumin: 4.5 g/dL (ref 3.5–5.0)
Alkaline Phosphatase: 59 U/L (ref 38–126)
Anion gap: 8 (ref 5–15)
BUN: 13 mg/dL (ref 6–20)
CO2: 28 mmol/L (ref 22–32)
Calcium: 9.4 mg/dL (ref 8.9–10.3)
Chloride: 103 mmol/L (ref 98–111)
Creatinine, Ser: 1.08 mg/dL (ref 0.61–1.24)
GFR calc Af Amer: >60 mL/min (ref >60)
GFR calc non Af Amer: >60 mL/min (ref >60)
Glucose, Bld: 106 mg/dL — ABNORMAL HIGH (ref 70–99)
Potassium: 4.6 mmol/L (ref 3.5–5.1)
Sodium: 139 mmol/L (ref 135–145)
Total Bilirubin: 2 mg/dL — ABNORMAL HIGH (ref 0.3–1.2)
Total Protein: 8.2 g/dL — ABNORMAL HIGH (ref 6.5–8.1)

## 2019-10-26 LAB — CBC
HCT: 49.7 % (ref 39.0–52.0)
Hemoglobin: 15.8 g/dL (ref 13.0–17.0)
MCH: 27.6 pg (ref 26.0–34.0)
MCHC: 31.8 g/dL (ref 30.0–36.0)
MCV: 86.9 fL (ref 80.0–100.0)
Platelets: 199 10*3/uL (ref 150–400)
RBC: 5.72 MIL/uL (ref 4.22–5.81)
RDW: 12.1 % (ref 11.5–15.5)
WBC: 4.1 10*3/uL (ref 4.0–10.5)
nRBC: 0 % (ref 0.0–0.2)

## 2019-10-26 LAB — LIPASE, BLOOD: Lipase: 25 U/L (ref 11–51)

## 2019-10-26 MED ORDER — METOCLOPRAMIDE HCL 5 MG/ML IJ SOLN
10.0000 mg | Freq: Once | INTRAMUSCULAR | Status: AC
  Administered 2019-10-26: 13:00:00 10 mg via INTRAVENOUS
  Filled 2019-10-26: qty 2

## 2019-10-26 MED ORDER — SODIUM CHLORIDE 0.9 % IV SOLN: Freq: Once | INTRAVENOUS | Status: AC

## 2019-10-26 MED ORDER — SODIUM CHLORIDE 0.9% FLUSH: 3.0000 mL | Freq: Once | INTRAVENOUS | Status: DC

## 2019-10-26 MED ORDER — FAMOTIDINE 20 MG PO TABS: 20.0000 mg | ORAL_TABLET | Freq: Two times a day (BID) | ORAL | 1 refills | Status: AC

## 2019-10-26 MED ORDER — ONDANSETRON 4 MG PO TBDP: 4.0000 mg | ORAL_TABLET | Freq: Three times a day (TID) | ORAL | 0 refills | Status: DC | PRN

## 2019-10-26 MED ORDER — KETOROLAC TROMETHAMINE 30 MG/ML IJ SOLN
30.0000 mg | Freq: Once | INTRAMUSCULAR | Status: AC
  Administered 2019-10-26: 13:00:00 30 mg via INTRAVENOUS
  Filled 2019-10-26: qty 1

## 2019-10-26 NOTE — ED Provider Notes (Signed)
[facility] Emergency Department Provider Note       Time seen: ----------------------------------------- 12:04 PM on 10/26/2019 -----------------------------------------   I have reviewed the triage vital signs and the nursing notes.  HISTORY   Chief Complaint Abdominal Pain and Headache    HPI Raymond Mcintyre is a 44 y.o. male with no known past medical history who presents to the ED for lower abdominal pain that began yesterday with diarrhea last night and vomiting this morning.  He is also had a headache, pain is 7 out of 10.  Patient reports his blood pressure was elevated when it was checked.  History reviewed. No pertinent past medical history.  There are no problems to display for this patient.   History reviewed. No pertinent surgical history.  Allergies Patient has no known allergies.  Social History Social History   Tobacco Use  . Smoking status: Never Smoker  . Smokeless tobacco: Never Used  Substance Use Topics  . Alcohol use: Yes    Comment: occ  . Drug use: No    Review of Systems Constitutional: Negative for fever. Cardiovascular: Negative for chest pain. Respiratory: Negative for shortness of breath. Gastrointestinal: Negative for abdominal pain, positive for vomiting and diarrhea Musculoskeletal: Negative for back pain. Skin: Negative for rash. Neurological: Positive for headache  All systems negative/normal/unremarkable except as stated in the HPI  ____________________________________________   PHYSICAL EXAM:  VITAL SIGNS: ED Triage Vitals  Enc Vitals Group     BP 10/26/19 1016 (!) 168/84     Pulse Rate 10/26/19 1016 85     Resp 10/26/19 1016 16     Temp 10/26/19 1016 98.7 F (37.1 C)     Temp Source 10/26/19 1016 Oral     SpO2 10/26/19 1016 96 %     Weight 10/26/19 1012 225 lb (102.1 kg)     Height 10/26/19 1012 6\' 4"  (1.93 m)     Head Circumference --      Peak Flow --      Pain Score 10/26/19  1012 7     Pain Loc --      Pain Edu? --      Excl. in [place]? --    Constitutional: Alert and oriented. Well appearing and in no distress. Eyes: Conjunctivae are normal. Normal extraocular movements. ENT      Head: Normocephalic and atraumatic.      Nose: No congestion/rhinnorhea.      Mouth/Throat: Mucous membranes are moist.      Neck: No stridor. Cardiovascular: Normal rate, regular rhythm. No murmurs, rubs, or gallops. Respiratory: Normal respiratory effort without tachypnea nor retractions. Breath sounds are clear and equal bilaterally. No wheezes/rales/rhonchi. Gastrointestinal: Soft and nontender. Normal bowel sounds Musculoskeletal: Nontender with normal range of motion in extremities. No lower extremity tenderness nor edema. Neurologic:  Normal speech and language. No gross focal neurologic deficits are appreciated.  Skin:  Skin is warm, dry and intact. No rash noted. Psychiatric: Mood and affect are normal. Speech and behavior are normal.  ____________________________________________  ED COURSE:  As part of my medical decision making, I reviewed the following data within the [place] History obtained from family if available, nursing notes, old chart and ekg, as well as notes from prior ED visits. Patient presented for headache as well as vomiting and diarrhea, we will assess with labs and imaging as indicated at this time.   Procedures  Raymond Mcintyre was evaluated in Emergency Department on 10/26/2019 for  the symptoms described in the history of present illness. He was evaluated in the context of the global COVID-19 pandemic, which necessitated consideration that the patient might be at risk for infection with the SARS-CoV-2 virus that causes COVID-19. Institutional protocols and algorithms that pertain to the evaluation of patients at risk for COVID-19 are in a state of rapid change based on information released by regulatory bodies including the CDC  and federal and state organizations. These policies and algorithms were followed during the patient's care in the ED.  ____________________________________________   LABS (pertinent positives/negatives)  Labs Reviewed  COMPREHENSIVE METABOLIC PANEL - Abnormal; Notable for the following components:      Result Value   Glucose, Bld 106 (*)    Total Protein 8.2 (*)    Total Bilirubin 2.0 (*)    All other components within normal limits  LIPASE, BLOOD  CBC  URINALYSIS, COMPLETE (UACMP) WITH MICROSCOPIC  ____________________________________________   DIFFERENTIAL DIAGNOSIS   Gastritis, gastroenteritis, anxiety, depression, pancreatitis, alcohol use disorder  FINAL ASSESSMENT AND PLAN  Abdominal pain, headache   Plan: The patient had presented for abdominal pain with vomiting and diarrhea, also headache.  Patient reports he has been under a lot of stress, working a lot and his father died a month ago.  He thinks he is drinking more than normal.  Patient's labs were grossly unremarkable, he has a chronic hyperbilirubinemia.  He was given fluids, Reglan and Toradol.  I have advised decreasing his alcohol intake and I will place him on Pepcid with Zofran as needed.  He is cleared for outpatient follow-up.   Ulice Dash, MD    Note: This note was generated in part or whole with voice recognition software. Voice recognition is usually quite accurate but there are transcription errors that can and very often do occur. I apologize for any typographical errors that were not detected and corrected.     Emily Filbert, MD 10/26/19 [phone]

## 2019-10-26 NOTE — ED Triage Notes (Signed)
Pt here with c/o lower abd pain that began yesterday, diarrhea last night and vomiting this am. Gaylyn Rong as well. NAD.

## 2019-10-26 NOTE — ED Notes (Signed)

## 2020-01-05 ENCOUNTER — Other Ambulatory Visit: Payer: Self-pay

## 2020-01-05 ENCOUNTER — Emergency Department
Admission: EM | Admit: 2020-01-05 | Discharge: 2020-01-05 | Disposition: A | Discharge status: Home / Self Care | Payer: Medicaid Other | Attending: Emergency Medicine | Admitting: Emergency Medicine

## 2020-01-05 ENCOUNTER — Encounter: Payer: Self-pay | Admitting: Emergency Medicine

## 2020-01-05 DIAGNOSIS — R519 Headache, unspecified: Secondary | ICD-10-CM | POA: Insufficient documentation

## 2020-01-05 DIAGNOSIS — B349 Viral infection, unspecified: Secondary | ICD-10-CM

## 2020-01-05 MED ORDER — KETOROLAC TROMETHAMINE 30 MG/ML IJ SOLN
30.0000 mg | Freq: Once | INTRAMUSCULAR | Status: AC
Start: 2020-01-05 — End: 2020-01-05
  Administered 2020-01-05: 30 mg via INTRAVENOUS
  Filled 2020-01-05: qty 1

## 2020-01-05 MED ORDER — SODIUM CHLORIDE 0.9 % IV BOLUS
1000.0000 mL | Freq: Once | INTRAVENOUS | Status: AC
  Administered 2020-01-05: 1000 mL via INTRAVENOUS

## 2020-01-05 MED ORDER — ONDANSETRON HCL 8 MG PO TABS: 8.0000 mg | ORAL_TABLET | Freq: Three times a day (TID) | ORAL | 0 refills | Status: DC | PRN

## 2020-01-05 MED ORDER — KETOROLAC TROMETHAMINE 10 MG PO TABS
10.0000 mg | ORAL_TABLET | Freq: Four times a day (QID) | ORAL | 0 refills | Status: AC | PRN
Start: 2020-01-05 — End: ?

## 2020-01-05 MED ORDER — FAMOTIDINE 20 MG PO TABS: 20.0000 mg | ORAL_TABLET | Freq: Every day | ORAL | 1 refills | Status: AC

## 2020-01-05 MED ORDER — DEXTROSE 5 % IV SOLN
20.0000 mg | Freq: Once | INTRAVENOUS | Status: AC
  Administered 2020-01-05: 20 mg via INTRAVENOUS
  Filled 2020-01-05: qty 4

## 2020-01-05 MED ORDER — ONDANSETRON HCL 4 MG/2ML IJ SOLN
4.0000 mg | Freq: Once | INTRAMUSCULAR | Status: AC
  Administered 2020-01-05: 4 mg via INTRAVENOUS
  Filled 2020-01-05: qty 2

## 2020-01-05 NOTE — ED Triage Notes (Signed)
Presents with h/a and lower back pain  States pain started about 2-3 days ago  Positive nausea  No fever or vomiting

## 2020-01-05 NOTE — ED Provider Notes (Signed)
[facility] Emergency Department Provider Note   ____________________________________________   First MD Initiated Contact with Patient 01/05/20 1145     (approximate)  I have reviewed the triage vital signs and the nursing notes.   HISTORY  Chief Complaint Back Pain and Headache    HPI Raymond Mcintyre is a 44 y.o. male patient presents with 2 days of frontal headache, nausea, and diarrhea.  Patient also complain of low back pain.  Patient denies radicular component to back pain.  Patient denies bladder or bowel dysfunction.  Patient with his back pain secondary to increased workload.  Patient states in the past  has had intermittent headaches associated with nausea. Patient denies recent travel or known contact with COVID-19. Patient denies any URI complaints.  Patient was seen previous for this complaint and stated he was doing fine until 2 to 3 days ago.  Patient relates increased stressors.          History reviewed. No pertinent past medical history.  There are no problems to display for this patient.   History reviewed. No pertinent surgical history.  Prior to Admission medications   Medication Sig Start Date End Date Taking? Authorizing Provider  acetaminophen (TYLENOL) 500 MG tablet Take 1,000 mg by mouth every 6 (six) hours as needed for mild pain.    [provider]  famotidine (PEPCID) 20 MG tablet Take 1 tablet (20 mg total) by mouth 2 (two) times daily. 10/26/19   Earleen Newport, MD  famotidine (PEPCID) 20 MG tablet Take 1 tablet (20 mg total) by mouth daily. 01/05/20 01/04/21  Sable Feil, PA-C  ketorolac (TORADOL) 10 MG tablet Take 1 tablet (10 mg total) by mouth every 6 (six) hours as needed. 01/05/20   Sable Feil, PA-C  ondansetron (ZOFRAN) 8 MG tablet Take 1 tablet (8 mg total) by mouth every 8 (eight) hours as needed for nausea or vomiting. 01/05/20   Sable Feil, PA-C    Allergies Patient has no  known allergies.  No family history on file.  Social History Social History   Tobacco Use  . Smoking status: Never Smoker  . Smokeless tobacco: Never Used  Substance Use Topics  . Alcohol use: Yes    Comment: occ  . Drug use: No    Review of Systems Constitutional: No fever/chills Eyes: No visual changes. ENT: No sore throat. Cardiovascular: Denies chest pain. Respiratory: Denies shortness of breath. Gastrointestinal: No abdominal pain.   nausea, no vomiting.  No diarrhea.  No constipation. Genitourinary: Negative for dysuria. Musculoskeletal: Negative for back pain. Skin: Negative for rash. Neurological: Positive for headaches, but denies focal weakness or numbness.  ____________________________________________   PHYSICAL EXAM:  VITAL SIGNS: ED Triage Vitals  Enc Vitals Group     BP 01/05/20 1118 (!) 159/99     Pulse Rate 01/05/20 1118 87     Resp 01/05/20 1118 14     Temp 01/05/20 1118 98.2 F (36.8 C)     Temp Source 01/05/20 1118 Oral     SpO2 01/05/20 1118 97 %     Weight 01/05/20 1117 264 lb 8.8 oz (120 kg)     Height 01/05/20 1117 6\' 4"  (1.93 m)     Head Circumference --      Peak Flow --      Pain Score --      Pain Loc --      Pain Edu? --      Excl. in  GC? --    Constitutional: Alert and oriented. Well appearing and in no acute distress. Eyes: Conjunctivae are normal. PERRL. EOMI. Head: Atraumatic. Nose: No congestion/rhinnorhea. Mouth/Throat: Mucous membranes are moist.  Oropharynx non-erythematous. Neck: No stridor.   Hematological/Lymphatic/Immunilogical: No cervical lymphadenopathy. Cardiovascular: Normal rate, regular rhythm. Grossly normal heart sounds.  Good peripheral circulation.  Elevated blood pressure. Respiratory: Normal respiratory effort.  No retractions. Lungs CTAB. Gastrointestinal: Soft and nontender. No distention. No abdominal bruits. No CVA tenderness. Musculoskeletal: No lower extremity tenderness nor edema.  No joint  effusions. Neurologic:  Normal speech and language. No gross focal neurologic deficits are appreciated. No gait instability. Skin:  Skin is warm, dry and intact. No rash noted. Psychiatric: Mood and affect are normal. Speech and behavior are normal.  ____________________________________________   LABS (all labs ordered are listed, but only abnormal results are displayed)  Labs Reviewed - No data to display ____________________________________________  EKG   ____________________________________________  RADIOLOGY  ED MD interpretation:    Official radiology report(s): No results found.  ____________________________________________   PROCEDURES  Procedure(s) performed (including Critical Care):  Procedures   ____________________________________________   INITIAL IMPRESSION / ASSESSMENT AND PLAN / ED COURSE  As part of my medical decision making, I reviewed the following data within the electronic MEDICAL RECORD NUMBER     Patient presents with 2 days of headache, nausea, and low back pain.  Patient denies URI signs or symptoms.  Physical exam consistent with viral illness.  Patient complaint improved status post IV rehydration, Zofran, Reglan, and Toradol.  Patient given discharge care instruction work note.  Patient [facility].    [name] was evaluated in Emergency Department on 01/05/2020 for the symptoms described in the history of present illness. He was evaluated in the context of the global COVID-19 pandemic, which necessitated consideration that the patient might be at risk for infection with the SARS-CoV-2 virus that causes COVID-19. Institutional protocols and algorithms that pertain to the evaluation of patients at risk for COVID-19 are in a state of rapid change based on information released by regulatory bodies including the CDC and federal and state organizations. These policies and algorithms were followed during the  patient's care in the ED.       ____________________________________________   FINAL CLINICAL IMPRESSION(S) / ED DIAGNOSES  Final diagnoses:  None     ED Discharge Orders         Ordered    ketorolac (TORADOL) 10 MG tablet  Every 6 hours PRN     01/05/20 1318    ondansetron (ZOFRAN) 8 MG tablet  Every 8 hours PRN     01/05/20 1318    famotidine (PEPCID) 20 MG tablet  Daily     01/05/20 1318           Note:  This document was prepared using Dragon voice recognition software and may include unintentional dictation errors.    Joni Reining, PA-C 01/05/20 1320    Sharyn Creamer, MD 01/05/20 [phone]

## 2020-01-05 NOTE — Discharge Instructions (Addendum)
Follow discharge care instruction take medication as directed. °

## 2020-04-25 ENCOUNTER — Other Ambulatory Visit: Payer: Self-pay

## 2020-04-25 ENCOUNTER — Emergency Department
Admission: EM | Admit: 2020-04-25 | Discharge: 2020-04-25 | Disposition: A | Discharge status: Home / Self Care | Payer: HRSA Program | Attending: Emergency Medicine | Admitting: Emergency Medicine

## 2020-04-25 ENCOUNTER — Encounter: Payer: Self-pay | Admitting: Emergency Medicine

## 2020-04-25 DIAGNOSIS — K2900 Acute gastritis without bleeding: Secondary | ICD-10-CM | POA: Diagnosis not present

## 2020-04-25 DIAGNOSIS — G44221 Chronic tension-type headache, intractable: Secondary | ICD-10-CM | POA: Insufficient documentation

## 2020-04-25 DIAGNOSIS — U071 COVID-19: Secondary | ICD-10-CM | POA: Diagnosis not present

## 2020-04-25 DIAGNOSIS — R519 Headache, unspecified: Secondary | ICD-10-CM

## 2020-04-25 LAB — COMPREHENSIVE METABOLIC PANEL
ALT: 39 U/L (ref 0–44)
AST: 25 U/L (ref 15–41)
Albumin: 4.6 g/dL (ref 3.5–5.0)
Alkaline Phosphatase: 62 U/L (ref 38–126)
Anion gap: 7 (ref 5–15)
BUN: 9 mg/dL (ref 6–20)
CO2: 26 mmol/L (ref 22–32)
Calcium: 9.2 mg/dL (ref 8.9–10.3)
Chloride: 103 mmol/L (ref 98–111)
Creatinine, Ser: 1.12 mg/dL (ref 0.61–1.24)
GFR calc Af Amer: >60 mL/min (ref >60)
GFR calc non Af Amer: >60 mL/min (ref >60)
Glucose, Bld: 98 mg/dL (ref 70–99)
Potassium: 4.1 mmol/L (ref 3.5–5.1)
Sodium: 136 mmol/L (ref 135–145)
Total Bilirubin: 1.8 mg/dL — ABNORMAL HIGH (ref 0.3–1.2)
Total Protein: 8.2 g/dL — ABNORMAL HIGH (ref 6.5–8.1)

## 2020-04-25 LAB — URINALYSIS, COMPLETE (UACMP) WITH MICROSCOPIC
Bacteria, UA: NONE SEEN
Bilirubin Urine: NEGATIVE
Glucose, UA: NEGATIVE mg/dL
Ketones, ur: NEGATIVE mg/dL
Leukocytes,Ua: NEGATIVE
Nitrite: NEGATIVE
Protein, ur: NEGATIVE mg/dL
Specific Gravity, Urine: 1.019 (ref 1.005–1.030)
pH: 6 (ref 5.0–8.0)

## 2020-04-25 LAB — CBC
HCT: 48 % (ref 39.0–52.0)
Hemoglobin: 15.5 g/dL (ref 13.0–17.0)
MCH: 28 pg (ref 26.0–34.0)
MCHC: 32.3 g/dL (ref 30.0–36.0)
MCV: 86.6 fL (ref 80.0–100.0)
Platelets: 160 10*3/uL (ref 150–400)
RBC: 5.54 MIL/uL (ref 4.22–5.81)
RDW: 11.9 % (ref 11.5–15.5)
WBC: 4 10*3/uL (ref 4.0–10.5)
nRBC: 0 % (ref 0.0–0.2)

## 2020-04-25 LAB — LIPASE, BLOOD: Lipase: 21 U/L (ref 11–51)

## 2020-04-25 LAB — SARS CORONAVIRUS 2 BY RT PCR (HOSPITAL ORDER, PERFORMED IN ~~LOC~~ HOSPITAL LAB): SARS Coronavirus 2: POSITIVE — AB

## 2020-04-25 LAB — TROPONIN I (HIGH SENSITIVITY): Troponin I (High Sensitivity): <2 ng/L (ref <18)

## 2020-04-25 MED ORDER — SUCRALFATE 1 G PO TABS
1.0000 g | ORAL_TABLET | Freq: Three times a day (TID) | ORAL | 0 refills | Status: AC
Start: 2020-04-25 — End: 2020-05-25

## 2020-04-25 MED ORDER — ACETAMINOPHEN 500 MG PO TABS
1000.0000 mg | ORAL_TABLET | Freq: Once | ORAL | Status: AC
  Administered 2020-04-25: 1000 mg via ORAL
  Filled 2020-04-25: qty 2

## 2020-04-25 MED ORDER — KETOROLAC TROMETHAMINE 30 MG/ML IJ SOLN
30.0000 mg | Freq: Once | INTRAMUSCULAR | Status: AC
  Administered 2020-04-25: 30 mg via INTRAMUSCULAR
  Filled 2020-04-25: qty 1

## 2020-04-25 MED ORDER — PANTOPRAZOLE SODIUM 20 MG PO TBEC
20.0000 mg | DELAYED_RELEASE_TABLET | Freq: Every day | ORAL | 0 refills | Status: AC
Start: 2020-04-25 — End: 2020-05-25

## 2020-04-25 NOTE — ED Provider Notes (Addendum)
[facility] Emergency Department Provider Note  ____________________________________________   First MD Initiated Contact with Patient 04/25/20 1415     (approximate)  I have reviewed the triage vital signs and the nursing notes.   HISTORY  Chief Complaint Headache and Abdominal Pain    HPI Raymond Mcintyre is a 44 y.o. male with alcohol abuse who comes in with multiple symptoms.  Patient reports having some chest pain for the past 3 to 4 days.  The center of his chest.  He states that they were constant but they are now relieved.  Denies any shortness of breath or chest pain currently.  He reports having a headache that is his biggest concern at this moment.  The headache is in the front of his head.  Denies any vision changes.  Denies it being sudden in onset has been constant for the past 3 days.  Has taken Tylenol without improvement.  He states that this is similar to his prior headaches that he gets.  Denies any nausea or vomiting with it.  He also reports little bit of epigastric tenderness.  He does drink 2-22 ounces of beer daily and reports using ibuprofen.  He denies any changes in his bowel movements or vomiting.  No lower abdominal pain.  No pain in his right upper quadrant.          History reviewed. No pertinent past medical history.  There are no problems to display for this patient.   History reviewed. No pertinent surgical history.  Prior to Admission medications   Medication Sig Start Date End Date Taking? Authorizing Provider  acetaminophen (TYLENOL) 500 MG tablet Take 1,000 mg by mouth every 6 (six) hours as needed for mild pain.    [provider]  famotidine (PEPCID) 20 MG tablet Take 1 tablet (20 mg total) by mouth 2 (two) times daily. 10/26/19   Emily Filbert, MD  famotidine (PEPCID) 20 MG tablet Take 1 tablet (20 mg total) by mouth daily. 01/05/20 01/04/21  Joni Reining, PA-C  ketorolac (TORADOL) 10 MG  tablet Take 1 tablet (10 mg total) by mouth every 6 (six) hours as needed. 01/05/20   Joni Reining, PA-C  ondansetron (ZOFRAN) 8 MG tablet Take 1 tablet (8 mg total) by mouth every 8 (eight) hours as needed for nausea or vomiting. 01/05/20   Joni Reining, PA-C    Allergies Patient has no known allergies.  No family history on file.  Social History Social History   Tobacco Use  . Smoking status: Never Smoker  . Smokeless tobacco: Never Used  Substance Use Topics  . Alcohol use: Yes    Comment: occ  . Drug use: No      Review of Systems Constitutional: No fever/chills Eyes: No visual changes. ENT: No sore throat. Cardiovascular: Chest pain Respiratory: Denies shortness of breath. Gastrointestinal: Abdominal pain no nausea, no vomiting.  No diarrhea.  No constipation. Genitourinary: Negative for dysuria. Musculoskeletal: Negative for back pain. Skin: Negative for rash. Neurological: Headache, no focal weakness or numbness. All other ROS negative ____________________________________________   PHYSICAL EXAM:  VITAL SIGNS: ED Triage Vitals  Enc Vitals Group     BP 04/25/20 1228 (!) 144/102     Pulse Rate 04/25/20 1228 96     Resp 04/25/20 1228 18     Temp 04/25/20 1228 98.6 F (37 C)     Temp Source 04/25/20 1228 Oral     SpO2 04/25/20 1228 98 %  Weight --      Height --      Head Circumference --      Peak Flow --      Pain Score 04/25/20 1237 8     Pain Loc --      Pain Edu? --      Excl. in GC? --     Constitutional: Alert and oriented. Well appearing and in no acute distress. Eyes: Conjunctivae are normal. EOMI. Head: Atraumatic. Nose: No congestion/rhinnorhea. Mouth/Throat: Mucous membranes are moist.   Neck: No stridor. Trachea Midline. FROM Cardiovascular: Normal rate, regular rhythm. Grossly normal heart sounds.  Good peripheral circulation. Respiratory: Normal respiratory effort.  No retractions. Lungs CTAB. Gastrointestinal: Soft with  some slight epigastric tenderness without any rebound or guarding.  No distention. No abdominal bruits.  Musculoskeletal: No lower extremity tenderness nor edema.  No joint effusions. Neurologic:  Normal speech and language. No gross focal neurologic deficits are appreciated.  Skin:  Skin is warm, dry and intact. No rash noted. Psychiatric: Mood and affect are normal. Speech and behavior are normal. GU: Deferred   ____________________________________________   LABS (all labs ordered are listed, but only abnormal results are displayed)  Labs Reviewed  COMPREHENSIVE METABOLIC PANEL - Abnormal; Notable for the following components:      Result Value   Total Protein 8.2 (*)    Total Bilirubin 1.8 (*)    All other components within normal limits  SARS CORONAVIRUS 2 BY RT PCR (HOSPITAL ORDER, PERFORMED IN [facility])  LIPASE, BLOOD  CBC  URINALYSIS, COMPLETE (UACMP) WITH MICROSCOPIC  TROPONIN I (HIGH SENSITIVITY)   ____________________________________________   ED ECG REPORT I, Concha Se, the attending physician, personally viewed and interpreted this ECG.  Normal sinus rhythm 93, no ST elevation, T wave inversion in lead III, normal intervals, but about block ____________________________________________   PROCEDURES  Procedure(s) performed (including Critical Care):  Procedures   ____________________________________________   INITIAL IMPRESSION / ASSESSMENT AND PLAN / ED COURSE  Raymond Mcintyre was evaluated in Emergency Department on 04/25/2020 for the symptoms described in the history of present illness. He was evaluated in the context of the global COVID-19 pandemic, which necessitated consideration that the patient might be at risk for infection with the SARS-CoV-2 virus that causes COVID-19. Institutional protocols and algorithms that pertain to the evaluation of patients at risk for COVID-19 are in a state of rapid change based on information  released by regulatory bodies including the CDC and federal and state organizations. These policies and algorithms were followed during the patient's care in the ED.    Patient is a 44 year old whose base concern is his headache.  Sound like its typical headache.  Denies any blurry vision to suggest venous thrombus.  Denies it being sudden onset to suggest subarachnoid hemorrhage.  Patient has been here before for headaches got better with Toradol.  For patient's abdominal pain I suspect that is related to alcoholic gastritis.  Labs were ordered to evaluate for cholecystitis, pancreatitis.  He had prior CTs imaging without evidence of gallstones and has no right upper quadrant tenderness to suggest cholecystitis.  For his chest pain, it is now gone. Have low suspicion for ACS but given his age will get EKG and cardiac markers.  No shortness of breath to suggest pneumonia, pneumothorax and the chest pain is now relieved.  Given patient's multiple symptoms will get Covid swab.  Labs are reassuring.  No evidence of pancreatitis, T bili slightly  elevated at 1.8 but is been elevated previously up to 2.6 no white count to suggest infection, no anemia.  Cardiac Marker was negative.  Symptoms of been going on for greater than 3 hours therefore ruled out  Discussed with patient needing to cut back on alcohol use due to his elevated total bilirubin.  UA without evidence of UTI  Reevaluated patient and he is feeling better.  He feels comfortable being discharged home at this time.  We will give him treatment for gastritis.  3:54 PM updated patient that he was Covid positive.  Explained that he to be out of work for 10 days.  He expressed understanding.  He denies any shortness of breath or cough at this time to suggest needing a chest x-ray.  He understands return cautions relation to Covid symptoms started sat July 17 so wrote work note to return July 27. 10 day quarantine.       ____________________________________________   FINAL CLINICAL IMPRESSION(S) / ED DIAGNOSES   Final diagnoses:  Acute gastritis without hemorrhage, unspecified gastritis type  Intractable headache, unspecified chronicity pattern, unspecified headache type      MEDICATIONS GIVEN DURING THIS VISIT:  Medications  ketorolac (TORADOL) 30 MG/ML injection 30 mg (30 mg Intramuscular Given 04/25/20 1440)  acetaminophen (TYLENOL) tablet 1,000 mg (1,000 mg Oral Given 04/25/20 1439)     ED Discharge Orders         Ordered    pantoprazole (PROTONIX) 20 MG tablet  Daily     Discontinue  Reprint     04/25/20 1512    sucralfate (CARAFATE) 1 g tablet  3 times daily with meals & bedtime     Discontinue  Reprint     04/25/20 1512           Note:  This document was prepared using Dragon voice recognition software and may include unintentional dictation errors.   Concha Se, MD 04/25/20 1514    Concha Se, MD 04/25/20 1555    Concha Se, MD 04/25/20 1600

## 2020-04-25 NOTE — ED Triage Notes (Signed)
Patient reports intermittent headache, abdominal pain and diarrhea since Sunday.

## 2020-04-25 NOTE — Discharge Instructions (Addendum)
I suspect your stomach pain is related to drinking alcohol causing gastritis or inflammation of your stomach.  Do not take ibuprofen or Motrin because it can make it worse.  Take Tylenol 1 g every 8 hours.  Take the Protonix to help decrease acid and the Carafate to help line your stomach.  Try to cut down your alcohol use.  Your liver function test is slightly elevated consistent with your alcohol use.     Person Under Monitoring Name: Raymond Mcintyre  Location: [address] [place] [address] [place] [address]   Infection Prevention Recommendations for Individuals Confirmed to have, or Being Evaluated for, 2019 Novel Coronavirus (COVID-19) Infection Who Receive Care at Home  Individuals who are confirmed to have, or are being evaluated for, COVID-19 should follow the prevention steps below until a healthcare provider or local or state health department says they can return to normal activities.  Stay home except to get medical care You should restrict activities outside your home, except for getting medical care. Do not go to work, school, or public areas, and do not use public transportation or taxis.  Call ahead before visiting your doctor Before your medical appointment, call the healthcare provider and tell them that you have, or are being evaluated for, COVID-19 infection. This will help the healthcare provider's office take steps to keep other people from getting infected. Ask your healthcare provider to call the local or state health department.  Monitor your symptoms Seek prompt medical attention if your illness is worsening (e.g., difficulty breathing). Before going to your medical appointment, call the healthcare provider and tell them that you have, or are being evaluated for, COVID-19 infection. Ask your healthcare provider to call the local or state health department.  Wear a facemask You should wear a facemask that covers your nose and mouth when you are in the  same room with other people and when you visit a healthcare provider. People who live with or visit you should also wear a facemask while they are in the same room with you.  Separate yourself from other people in your home As much as possible, you should stay in a different room from other people in your home. Also, you should use a separate bathroom, if available.  Avoid sharing household items You should not share dishes, drinking glasses, cups, eating utensils, towels, bedding, or other items with other people in your home. After using these items, you should wash them thoroughly with soap and water.  Cover your coughs and sneezes Cover your mouth and nose with a tissue when you cough or sneeze, or you can cough or sneeze into your sleeve. Throw used tissues in a lined trash can, and immediately wash your hands with soap and water for at least 20 seconds or use an alcohol-based hand rub.  Wash your [employer] your hands often and thoroughly with soap and water for at least 20 seconds. You can use an alcohol-based hand sanitizer if soap and water are not available and if your hands are not visibly dirty. Avoid touching your eyes, nose, and mouth with unwashed hands.   Prevention Steps for Caregivers and Household Members of Individuals Confirmed to have, or Being Evaluated for, COVID-19 Infection Being Cared for in the Home  If you live with, or provide care at home for, a person confirmed to have, or being evaluated for, COVID-19 infection please follow these guidelines to prevent infection:  Follow healthcare provider's instructions Make sure that you understand  and can help the patient follow any healthcare provider instructions for all care.  Provide for the patient's basic needs You should help the patient with basic needs in the home and provide support for getting groceries, prescriptions, and other personal needs.  Monitor the patient's symptoms If they are getting  sicker, call his or her medical provider and tell them that the patient has, or is being evaluated for, COVID-19 infection. This will help the healthcare provider's office take steps to keep other people from getting infected. Ask the healthcare provider to call the local or state health department.  Limit the number of people who have contact with the patient If possible, have only one caregiver for the patient. Other household members should stay in another home or place of residence. If this is not possible, they should stay in another room, or be separated from the patient as much as possible. Use a separate bathroom, if available. Restrict visitors who do not have an essential need to be in the home.  Keep older adults, very young children, and other sick people away from the patient Keep older adults, very young children, and those who have compromised immune systems or chronic health conditions away from the patient. This includes people with chronic heart, lung, or kidney conditions, diabetes, and cancer.  Ensure good ventilation Make sure that shared spaces in the home have good air flow, such as from an air conditioner or an opened window, weather permitting.  Wash your hands often Wash your hands often and thoroughly with soap and water for at least 20 seconds. You can use an alcohol based hand sanitizer if soap and water are not available and if your hands are not visibly dirty. Avoid touching your eyes, nose, and mouth with unwashed hands. Use disposable paper towels to dry your hands. If not available, use dedicated cloth towels and replace them when they become wet.  Wear a facemask and gloves Wear a disposable facemask at all times in the room and gloves when you touch or have contact with the patient's blood, body fluids, and/or secretions or excretions, such as sweat, saliva, sputum, nasal mucus, vomit, urine, or feces.  Ensure the mask fits over your nose and mouth tightly,  and do not touch it during use. Throw out disposable facemasks and gloves after using them. Do not reuse. Wash your hands immediately after removing your facemask and gloves. If your personal clothing becomes contaminated, carefully remove clothing and launder. Wash your hands after handling contaminated clothing. Place all used disposable facemasks, gloves, and other waste in a lined container before disposing them with other household waste. Remove gloves and wash your hands immediately after handling these items.  Do not share dishes, glasses, or other household items with the patient Avoid sharing household items. You should not share dishes, drinking glasses, cups, eating utensils, towels, bedding, or other items with a patient who is confirmed to have, or being evaluated for, COVID-19 infection. After the person uses these items, you should wash them thoroughly with soap and water.  Wash laundry thoroughly Immediately remove and wash clothes or bedding that have blood, body fluids, and/or secretions or excretions, such as sweat, saliva, sputum, nasal mucus, vomit, urine, or feces, on them. Wear gloves when handling laundry from the patient. Read and follow directions on labels of laundry or clothing items and detergent. In general, wash and dry with the warmest temperatures recommended on the label.  Clean all areas the individual has used often Clean  all touchable surfaces, such as counters, tabletops, doorknobs, bathroom fixtures, toilets, phones, keyboards, tablets, and bedside tables, every day. Also, clean any surfaces that may have blood, body fluids, and/or secretions or excretions on them. Wear gloves when cleaning surfaces the patient has come in contact with. Use a diluted bleach solution (e.g., dilute bleach with 1 part bleach and 10 parts water) or a household disinfectant with a label that says EPA-registered for coronaviruses. To make a bleach solution at home, add 1 tablespoon  of bleach to 1 quart (4 cups) of water. For a larger supply, add  cup of bleach to 1 gallon (16 cups) of water. Read labels of cleaning products and follow recommendations provided on product labels. Labels contain instructions for safe and effective use of the cleaning product including precautions you should take when applying the product, such as wearing gloves or eye protection and making sure you have good ventilation during use of the product. Remove gloves and wash hands immediately after cleaning.  Monitor yourself for signs and symptoms of illness Caregivers and household members are considered close contacts, should monitor their health, and will be asked to limit movement outside of the home to the extent possible. Follow the monitoring steps for close contacts listed on the symptom monitoring form.   ? If you have additional questions, contact your local health department or call the epidemiologist on call at [phone] (available 24/7). ? This guidance is subject to change. For the most up-to-date guidance from [facility], please refer to their website: [email or web address]

## 2020-04-26 ENCOUNTER — Telehealth: Payer: Self-pay | Admitting: Nurse Practitioner

## 2020-04-26 NOTE — Telephone Encounter (Signed)
Called to Discuss with patient about Covid symptoms and the use of bamlanivimab, a monoclonal antibody infusion for those with mild to moderate Covid symptoms and at a high risk of hospitalization.     Pt needs to be qualified for this infusion at the [facility] infusion center due to co-morbid conditions and/or a member of an at-risk group.     Unable to reach pt  

## 2020-04-27 ENCOUNTER — Telehealth: Payer: Self-pay | Admitting: Nurse Practitioner

## 2020-04-27 NOTE — Telephone Encounter (Signed)
Called to discuss with Raymond Mcintyre about Covid symptoms and the use of casirivimab/imdevimab, a combination monoclonal antibody infusion for those with mild to moderate Covid symptoms and at a high risk of hospitalization.     Pt is qualified for this infusion at the [facility] infusion center due to co-morbid conditions and/or a member of an at-risk group (BMI >25). Per ED reports symptoms started  Unable to reach. Voicemail left.    There are no problems to display for this patient.   Willette Alma, AGPCNP-BC Pager: [phone] Amion: Thea Alken

## 2021-07-12 ENCOUNTER — Emergency Department (HOSPITAL_COMMUNITY)
Admission: EM | Admit: 2021-07-12 | Discharge: 2021-07-12 | Disposition: A | Discharge status: Home / Self Care | Payer: Medicaid Other | Attending: Emergency Medicine | Admitting: Emergency Medicine

## 2021-07-12 ENCOUNTER — Other Ambulatory Visit: Payer: Self-pay

## 2021-07-12 ENCOUNTER — Encounter (HOSPITAL_COMMUNITY): Payer: Self-pay | Admitting: Emergency Medicine

## 2021-07-12 DIAGNOSIS — R519 Headache, unspecified: Secondary | ICD-10-CM | POA: Insufficient documentation

## 2021-07-12 DIAGNOSIS — Z20822 Contact with and (suspected) exposure to covid-19: Secondary | ICD-10-CM | POA: Insufficient documentation

## 2021-07-12 DIAGNOSIS — R1084 Generalized abdominal pain: Secondary | ICD-10-CM | POA: Insufficient documentation

## 2021-07-12 DIAGNOSIS — R5383 Other fatigue: Secondary | ICD-10-CM | POA: Insufficient documentation

## 2021-07-12 DIAGNOSIS — R0981 Nasal congestion: Secondary | ICD-10-CM | POA: Insufficient documentation

## 2021-07-12 DIAGNOSIS — R030 Elevated blood-pressure reading, without diagnosis of hypertension: Secondary | ICD-10-CM | POA: Insufficient documentation

## 2021-07-12 DIAGNOSIS — R110 Nausea: Secondary | ICD-10-CM | POA: Insufficient documentation

## 2021-07-12 LAB — CBC WITH DIFFERENTIAL/PLATELET
Abs Immature Granulocytes: 0.01 10*3/uL (ref 0.00–0.07)
Basophils Absolute: 0 10*3/uL (ref 0.0–0.1)
Basophils Relative: 1 %
Eosinophils Absolute: 0 10*3/uL (ref 0.0–0.5)
Eosinophils Relative: 0 %
HCT: 46 % (ref 39.0–52.0)
Hemoglobin: 14.4 g/dL (ref 13.0–17.0)
Immature Granulocytes: 0 %
Lymphocytes Relative: 18 %
Lymphs Abs: 1 10*3/uL (ref 0.7–4.0)
MCH: 28.7 pg (ref 26.0–34.0)
MCHC: 31.3 g/dL (ref 30.0–36.0)
MCV: 91.6 fL (ref 80.0–100.0)
Monocytes Absolute: 0.3 10*3/uL (ref 0.1–1.0)
Monocytes Relative: 6 %
Neutro Abs: 4.3 10*3/uL (ref 1.7–7.7)
Neutrophils Relative %: 75 %
Platelets: 190 10*3/uL (ref 150–400)
RBC: 5.02 MIL/uL (ref 4.22–5.81)
RDW: 12.2 % (ref 11.5–15.5)
WBC: 5.7 10*3/uL (ref 4.0–10.5)
nRBC: 0 % (ref 0.0–0.2)

## 2021-07-12 LAB — COMPREHENSIVE METABOLIC PANEL
ALT: 26 U/L (ref 0–44)
AST: 21 U/L (ref 15–41)
Albumin: 4.4 g/dL (ref 3.5–5.0)
Alkaline Phosphatase: 69 U/L (ref 38–126)
Anion gap: 7 (ref 5–15)
BUN: 10 mg/dL (ref 6–20)
CO2: 28 mmol/L (ref 22–32)
Calcium: 9.3 mg/dL (ref 8.9–10.3)
Chloride: 101 mmol/L (ref 98–111)
Creatinine, Ser: 0.87 mg/dL (ref 0.61–1.24)
GFR, Estimated: >60 mL/min (ref >60)
Glucose, Bld: 98 mg/dL (ref 70–99)
Potassium: 4.1 mmol/L (ref 3.5–5.1)
Sodium: 136 mmol/L (ref 135–145)
Total Bilirubin: 1.9 mg/dL — ABNORMAL HIGH (ref 0.3–1.2)
Total Protein: 7.7 g/dL (ref 6.5–8.1)

## 2021-07-12 LAB — RESP PANEL BY RT-PCR (FLU A&B, COVID) ARPGX2
Influenza A by PCR: NEGATIVE
Influenza B by PCR: NEGATIVE
SARS Coronavirus 2 by RT PCR: NEGATIVE

## 2021-07-12 LAB — LIPASE, BLOOD: Lipase: 34 U/L (ref 11–51)

## 2021-07-12 MED ORDER — ONDANSETRON HCL 4 MG PO TABS: 4.0000 mg | ORAL_TABLET | Freq: Four times a day (QID) | ORAL | 0 refills | Status: AC

## 2021-07-12 NOTE — Discharge Instructions (Signed)
You have been seen here for URI like symptoms.  I recommend taking Tylenol for fever control and ibuprofen for pain control please follow dosing on the back of bottle.  I recommend staying hydrated and if you do not an appetite, I recommend soups as this will provide you with fluids and calories.  Your Covid test is pending I recommend self quarantine until you get your results back on MyChart.    If you are Covid positive you must self quarantine for 5 days starting on symptom onset, if at the end of those 5 days you are feeling better you may return back to school/work, if you continue to have symptoms you must self quarantine for additional 5 days.  I would like you to contact "post Covid care" as they will provide you with information how to manage your Covid symptoms if you are Covid positive.  Or if you are Covid negative and continue of symptoms after 1 to 2 weeks you may follow-up with your primary care provider  Come back to the emergency department if you develop chest pain, shortness of breath, severe abdominal pain, uncontrolled nausea, vomiting, diarrhea.  

## 2021-07-12 NOTE — ED Triage Notes (Signed)
Pt c/o headache and abd pain that started this morning

## 2021-07-12 NOTE — ED Provider Notes (Signed)
[facility] EMERGENCY DEPARTMENT Provider Note   CSN: [id number] Arrival date & time: 07/12/21  1318     History Chief Complaint  Patient presents with   Abdominal Pain    Raymond Mcintyre is a 45 y.o. male.  HPI  Patient with no significant medical history presents to the emergency department with chief complaint of headache and stomach pain.  Patient states this started this morning, started after he woke up, states both his headache and stomach pain started at the same time.  He states his stomach pain is  in his upper abdomen, does not radiate, has associated nausea without vomiting, no constipation, diarrhea, denies any urinary symptoms.  He has no significant GI history, denies NSAID use, alcohol use, history of PUD, diverticulitis, bowel obstructions, states he still passing gas and having normal bowel movements.  States that the headache is in the middle of his head, does not radiate, not associated with change in vision, paresthesias or weakness/ upper lower extremities, denies recent head trauma, is not on anticoagulant.  States that this is a normal headache is not the worst headache of his life.  He does endorse that he has felt general fatigue but does not endorse  fevers, chills, nasal congestion, sore throat or cough, he is vaccinated COVID-19, is not aware of any recent sick contacts.  History reviewed. No pertinent past medical history.  There are no problems to display for this patient.   History reviewed. No pertinent surgical history.     History reviewed. No pertinent family history.  Social History   Tobacco Use   Smoking status: Never   Smokeless tobacco: Never  Substance Use Topics   Alcohol use: Yes    Comment: occ   Drug use: No    Home Medications Prior to Admission medications   Medication Sig Start Date End Date Taking? Authorizing Provider  ondansetron (ZOFRAN) 4 MG tablet Take 1 tablet (4 mg total) by mouth every 6 (six) hours. 07/12/21  Yes  Marcello Fennel, PA-C  acetaminophen (TYLENOL) 500 MG tablet Take 1,000 mg by mouth every 6 (six) hours as needed for mild pain.    [provider]  famotidine (PEPCID) 20 MG tablet Take 1 tablet (20 mg total) by mouth 2 (two) times daily. 10/26/19   Earleen Newport, MD  famotidine (PEPCID) 20 MG tablet Take 1 tablet (20 mg total) by mouth daily. 01/05/20 01/04/21  Sable Feil, PA-C  ketorolac (TORADOL) 10 MG tablet Take 1 tablet (10 mg total) by mouth every 6 (six) hours as needed. 01/05/20   Sable Feil, PA-C  pantoprazole (PROTONIX) 20 MG tablet Take 1 tablet (20 mg total) by mouth daily. 04/25/20 05/25/20  Vanessa Aztec, MD  sucralfate (CARAFATE) 1 g tablet Take 1 tablet (1 g total) by mouth 4 (four) times daily -  with meals and at bedtime. 04/25/20 05/25/20  Vanessa Flaxville, MD    Allergies    Patient has no known allergies.  Review of Systems   Review of Systems  Constitutional:  Positive for fatigue. Negative for chills and fever.  HENT:  Negative for congestion.   Respiratory:  Negative for shortness of breath.   Cardiovascular:  Negative for chest pain.  Gastrointestinal:  Positive for abdominal pain and nausea. Negative for vomiting.  Genitourinary:  Negative for enuresis.  Musculoskeletal:  Negative for back pain and myalgias.  Skin:  Negative for rash.  Neurological:  Positive for headaches. Negative for dizziness.  Hematological:  Does not bruise/bleed easily.   Physical Exam Updated Vital Signs BP (!) 162/99 (BP Location: Left Arm)   Pulse 77   Temp 98.1 F (36.7 C) (Oral)   Resp 18   Ht 6' 4"  (1.93 m)   Wt 99.7 kg   SpO2 100%   BMI 26.75 kg/m   Physical Exam Vitals and nursing note reviewed.  Constitutional:      General: He is not in acute distress.    Appearance: He is not ill-appearing.  HENT:     Head: Normocephalic and atraumatic.     Right Ear: Tympanic membrane, ear canal and external ear normal.     Left Ear: Tympanic membrane, ear  canal and external ear normal.     Nose: Congestion present.     Comments: Erythematous turbinates bilaterally.    Mouth/Throat:     Mouth: Mucous membranes are moist.     Pharynx: Oropharynx is clear. No oropharyngeal exudate or posterior oropharyngeal erythema.  Eyes:     Conjunctiva/sclera: Conjunctivae normal.  Cardiovascular:     Rate and Rhythm: Normal rate and regular rhythm.     Pulses: Normal pulses.     Heart sounds: No murmur heard.   No friction rub. No gallop.  Pulmonary:     Effort: No respiratory distress.     Breath sounds: No wheezing, rhonchi or rales.  Abdominal:     Palpations: Abdomen is soft.     Tenderness: There is no abdominal tenderness. There is no right CVA tenderness or left CVA tenderness.     Comments: Abdomen nondistended, normal bowel sounds, dull to percussion, it is nontender to palpation, no guarding, rebound tenderness, peritoneal sign.  Musculoskeletal:     Right lower leg: No edema.     Left lower leg: No edema.  Skin:    General: Skin is warm and dry.  Neurological:     Mental Status: He is alert.  Psychiatric:        Mood and Affect: Mood normal.    ED Results / Procedures / Treatments   Labs (all labs ordered are listed, but only abnormal results are displayed) Labs Reviewed  COMPREHENSIVE METABOLIC PANEL - Abnormal; Notable for the following components:      Result Value   Total Bilirubin 1.9 (*)    All other components within normal limits  RESP PANEL BY RT-PCR (FLU A&B, COVID) ARPGX2  CBC WITH DIFFERENTIAL/PLATELET  LIPASE, BLOOD    EKG None  Radiology No results found.  Procedures Procedures   Medications Ordered in ED Medications - No data to display  ED Course  I have reviewed the triage vital signs and the nursing notes.  Pertinent labs & imaging results that were available during my care of the patient were reviewed by me and considered in my medical decision making (see chart for details).    MDM  Rules/Calculators/A&P                          Initial impression-patient presents with headache and stomach pain.  He is alert, does not appear in acute stress, vital signs reassuring.  Unclear etiology likely viral infection, will obtain basic lab work-up, COVID test and reassess.  Work-up-CBC unremarkable, CMP shows slight increase in T bili but appears to be less than his baseline, lipase is 34.  GERD panel is pending  Reassessment-patient is reassessed states he has no complaints this time, denies nausea or vomiting, denies any stomach  pain, states his headache is since resolved.  Patient agreed for discharge.  Rule out- Low suspicion for systemic infection as patient is nontoxic-appearing, vital signs reassuring, no obvious source infection noted on exam.  Low suspicion for pneumonia as lung sounds are clear bilaterally, will defer x-ray as lung sounds are clear bilaterally, also likely developed pneumonia in less than 24 hours.  I have low suspicion for PE as patient denies pleuritic chest pain, shortness of breath, patient is PERC. low suspicion for strep throat as oropharynx was visualized, no erythema or exudates noted.  Low suspicion patient would need  hospitalized due to viral infection or Covid as vital signs reassuring, patient is not in respiratory distress.  I have low suspicion for liver or gallbladder abnormality as he has no right upper quadrant tenderness, no elevation liver enzymes, alk phos. does have slightly elevated T bili but this is actually less than his baseline.  Suspect this is more transient. low  Suspicion for pancreatitis as lipase within normal limits.  Low suspicion for bowel obstruction as abdomen is nondistended dull to percussion, still passing gas and having normal bowel movement.  Low Suspicion for complicated diverticulitis is nontoxic-appearing, vital signs reassuring, no leukocytosis present.  Low suspicion for appendicitis as presentation atypical etiology.   Noted that patient has a slightly elevated blood pressure, he was made aware this, I doubt he is in hypertensive urgency or emergency as he has no signs of organ damage.  will have him follow-up with primary care for further evaluation.   Plan-  Headache stomach pain since resolved-suspect patient is having from viral infection, will recommend over-the-counter pain medications, follow-up with PCP as needed.  Will defer antiviral treatment for patient if COVID-positive as he has no risk factors for adverse outcome i.e. mild symptoms, hemodynamically stable, no comorbidities. High blood pressure-unclear etiology will have him follow-up with PCP for further evaluation.  Vital signs have remained stable, no indication for hospital admission.  Patient discussed with attending and they agreed with assessment and plan.  Patient given at home care as well strict return precautions.  Patient verbalized that they understood agreed to said plan.  Final Clinical Impression(s) / ED Diagnoses Final diagnoses:  Generalized abdominal pain  Elevated blood pressure reading    Rx / DC Orders ED Discharge Orders          Ordered    ondansetron (ZOFRAN) 4 MG tablet  Every 6 hours        07/12/21 1738             Marcello Fennel, PA-C 07/12/21 1739    Carmin Muskrat, MD 07/13/21 1408

## 2024-02-28 ENCOUNTER — Emergency Department (HOSPITAL_COMMUNITY)

## 2024-02-28 ENCOUNTER — Encounter (HOSPITAL_COMMUNITY): Payer: Self-pay

## 2024-02-28 ENCOUNTER — Emergency Department (HOSPITAL_COMMUNITY)
Admission: EM | Admit: 2024-02-28 | Discharge: 2024-02-29 | Disposition: A | Discharge status: Home / Self Care | Attending: Emergency Medicine | Admitting: Emergency Medicine

## 2024-02-28 DIAGNOSIS — R531 Weakness: Secondary | ICD-10-CM | POA: Insufficient documentation

## 2024-02-28 DIAGNOSIS — I10 Essential (primary) hypertension: Secondary | ICD-10-CM | POA: Diagnosis not present

## 2024-02-28 DIAGNOSIS — R1030 Lower abdominal pain, unspecified: Secondary | ICD-10-CM | POA: Diagnosis present

## 2024-02-28 DIAGNOSIS — N3000 Acute cystitis without hematuria: Secondary | ICD-10-CM | POA: Insufficient documentation

## 2024-02-28 DIAGNOSIS — R519 Headache, unspecified: Secondary | ICD-10-CM | POA: Insufficient documentation

## 2024-02-28 HISTORY — DX: Personal history of peptic ulcer disease: Z87.11

## 2024-02-28 HISTORY — DX: Essential (primary) hypertension: I10

## 2024-02-28 LAB — CBC WITH DIFFERENTIAL/PLATELET
Abs Immature Granulocytes: 0.01 10*3/uL (ref 0.00–0.07)
Basophils Absolute: 0.1 10*3/uL (ref 0.0–0.1)
Basophils Relative: 1 %
Eosinophils Absolute: 0 10*3/uL (ref 0.0–0.5)
Eosinophils Relative: 0 %
HCT: 43.7 % (ref 39.0–52.0)
Hemoglobin: 14.1 g/dL (ref 13.0–17.0)
Immature Granulocytes: 0 %
Lymphocytes Relative: 17 %
Lymphs Abs: 1.5 10*3/uL (ref 0.7–4.0)
MCH: 28.7 pg (ref 26.0–34.0)
MCHC: 32.3 g/dL (ref 30.0–36.0)
MCV: 89 fL (ref 80.0–100.0)
Monocytes Absolute: 0.7 10*3/uL (ref 0.1–1.0)
Monocytes Relative: 8 %
Neutro Abs: 6.7 10*3/uL (ref 1.7–7.7)
Neutrophils Relative %: 74 %
Platelets: 207 10*3/uL (ref 150–400)
RBC: 4.91 MIL/uL (ref 4.22–5.81)
RDW: 12.3 % (ref 11.5–15.5)
WBC: 9.1 10*3/uL (ref 4.0–10.5)
nRBC: 0 % (ref 0.0–0.2)

## 2024-02-28 LAB — COMPREHENSIVE METABOLIC PANEL WITH GFR
ALT: 49 U/L — ABNORMAL HIGH (ref 0–44)
AST: 44 U/L — ABNORMAL HIGH (ref 15–41)
Albumin: 3.7 g/dL (ref 3.5–5.0)
Alkaline Phosphatase: 90 U/L (ref 38–126)
Anion gap: 7 (ref 5–15)
BUN: 15 mg/dL (ref 6–20)
CO2: 25 mmol/L (ref 22–32)
Calcium: 8.8 mg/dL — ABNORMAL LOW (ref 8.9–10.3)
Chloride: 101 mmol/L (ref 98–111)
Creatinine, Ser: 1.21 mg/dL (ref 0.61–1.24)
GFR, Estimated: >60 mL/min (ref >60)
Glucose, Bld: 116 mg/dL — ABNORMAL HIGH (ref 70–99)
Potassium: 4.2 mmol/L (ref 3.5–5.1)
Sodium: 133 mmol/L — ABNORMAL LOW (ref 135–145)
Total Bilirubin: 1.3 mg/dL — ABNORMAL HIGH (ref 0.0–1.2)
Total Protein: 7.3 g/dL (ref 6.5–8.1)

## 2024-02-28 LAB — LIPASE, BLOOD: Lipase: 30 U/L (ref 11–51)

## 2024-02-28 MED ORDER — IOHEXOL 350 MG/ML SOLN
80.0000 mL | Freq: Once | INTRAVENOUS | Status: AC | PRN
  Administered 2024-02-28: 80 mL via INTRAVENOUS

## 2024-02-28 MED ORDER — MORPHINE SULFATE (PF) 4 MG/ML IV SOLN
4.0000 mg | Freq: Once | INTRAVENOUS | Status: AC
  Administered 2024-02-28: 4 mg via INTRAVENOUS
  Filled 2024-02-28: qty 1

## 2024-02-28 MED ORDER — ONDANSETRON HCL 4 MG/2ML IJ SOLN
4.0000 mg | Freq: Once | INTRAMUSCULAR | Status: AC
  Administered 2024-02-28: 4 mg via INTRAVENOUS
  Filled 2024-02-28: qty 2

## 2024-02-28 MED ORDER — SODIUM CHLORIDE 0.9 % IV BOLUS
1000.0000 mL | Freq: Once | INTRAVENOUS | Status: AC
  Administered 2024-02-28: 1000 mL via INTRAVENOUS

## 2024-02-28 NOTE — ED Triage Notes (Signed)
 Pt comes in for lower abd. Pain, nausea and general weakness for the past 3 days. Pt is eating and drinking okay and last BM was yesterday. Denies any new foods or medications. A&Ox4. Pt also has left leg pain started yesterday. PMS intact.   Pt has hx of ulcers.

## 2024-02-28 NOTE — ED Provider Notes (Signed)
 [place] EMERGENCY DEPARTMENT AT [facility]  Provider Note  CSN: [id number] Arrival date & time: 02/28/24 2035  History Chief Complaint  Patient presents with   Abdominal Pain    Raymond Mcintyre is a 48 y.o. male with history of HTN and PUD reports 3 days of persistent sharp lower abdominal pain, associated with some nausea, generalized weakness, headache. Sometimes radiates into L leg. No fever. No vomiting. No bowel or bladder symptoms. Has been seen in the past for similar, but no recent visits and no CT imaging since 2018.    Home Medications Prior to Admission medications   Medication Sig Start Date End Date Taking? Authorizing Provider  cephALEXin (KEFLEX) 500 MG capsule Take 1 capsule (500 mg total) by mouth 2 (two) times daily for 7 days. 02/29/24 03/07/24 Yes Charmayne Cooper, MD  acetaminophen  (TYLENOL ) 500 MG tablet Take 1,000 mg by mouth every 6 (six) hours as needed for mild pain.    [provider]  famotidine  (PEPCID ) 20 MG tablet Take 1 tablet (20 mg total) by mouth 2 (two) times daily. 10/26/19   Shayne Demark, MD  famotidine  (PEPCID ) 20 MG tablet Take 1 tablet (20 mg total) by mouth daily. 01/05/20 01/04/21  Marcina Severe, PA-C  ketorolac  (TORADOL ) 10 MG tablet Take 1 tablet (10 mg total) by mouth every 6 (six) hours as needed. 01/05/20   Marcina Severe, PA-C  ondansetron  (ZOFRAN ) 4 MG tablet Take 1 tablet (4 mg total) by mouth every 6 (six) hours. 07/12/21   Volney Grumbles, PA-C  pantoprazole  (PROTONIX ) 20 MG tablet Take 1 tablet (20 mg total) by mouth daily. 04/25/20 05/25/20  Lubertha Rush, MD  sucralfate  (CARAFATE ) 1 g tablet Take 1 tablet (1 g total) by mouth 4 (four) times daily -  with meals and at bedtime. 04/25/20 05/25/20  Lubertha Rush, MD     Allergies    Patient has no known allergies.   Review of Systems   Review of Systems Please see HPI for pertinent positives and negatives  Physical Exam BP (!) 166/102   Pulse (!) 105    Temp 97.9 F (36.6 C) (Oral)   Resp 14   Ht 6\' 4"  (1.93 m)   Wt 99.8 kg   SpO2 94%   BMI 26.78 kg/m   Physical Exam Vitals and nursing note reviewed.  Constitutional:      Appearance: Normal appearance.  HENT:     Head: Normocephalic and atraumatic.     Nose: Nose normal.     Mouth/Throat:     Mouth: Mucous membranes are moist.  Eyes:     Extraocular Movements: Extraocular movements intact.     Conjunctiva/sclera: Conjunctivae normal.  Cardiovascular:     Rate and Rhythm: Normal rate.  Pulmonary:     Effort: Pulmonary effort is normal.     Breath sounds: Normal breath sounds.  Abdominal:     General: Abdomen is flat.     Palpations: Abdomen is soft.     Tenderness: There is abdominal tenderness in the suprapubic area and left lower quadrant. There is no guarding. Negative signs include Murphy's sign and McBurney's sign.  Musculoskeletal:        General: No swelling. Normal range of motion.     Cervical back: Neck supple.  Skin:    General: Skin is warm and dry.  Neurological:     General: No focal deficit present.     Mental Status: He is  alert.  Psychiatric:        Mood and Affect: Mood normal.     ED Results / Procedures / Treatments   EKG None  Procedures Procedures  Medications Ordered in the ED Medications  morphine  (PF) 4 MG/ML injection 4 mg (4 mg Intravenous Given 02/28/24 2330)  ondansetron  (ZOFRAN ) injection 4 mg (4 mg Intravenous Given 02/28/24 2330)  sodium chloride  0.9 % bolus 1,000 mL (0 mLs Intravenous Stopped 02/29/24 0112)  iohexol (OMNIPAQUE) 350 MG/ML injection 80 mL (80 mLs Intravenous Contrast Given 02/28/24 2340)  cefTRIAXone (ROCEPHIN) 1 g in sodium chloride  0.9 % 100 mL IVPB (0 g Intravenous Stopped 02/29/24 0208)    Initial Impression and Plan  Patient here with lower abdominal pain, more so on the left. Some tenderness but no peritoneal signs. Labs done in triage show normal CBC, BMP with only mildly elevated LFTs, ?dehydration.  Lipase is normal. Will give pain/nausea meds and IVF for comfort and send for CT.   ED Course   Clinical Course as of 02/29/24 0359  Paulene Boron Feb 29, 2024  0034 I personally viewed the images from radiology studies and agree with radiologist interpretation: CT is neg for acute process.  [CS]  0054 UA with signs of UTI as the likely cause of his suprapubic pain. Also consider prostatitis but his scan did not show signs of same. He denies any concern for STI. Will send for culture. Begin keflex and recommend PCP follow up, RTED for any other concerns.   [CS]    Clinical Course User Index [CS] Charmayne Cooper, MD     MDM Rules/Calculators/A&P Medical Decision Making Problems Addressed: Acute cystitis without hematuria: acute illness or injury  Amount and/or Complexity of Data Reviewed Labs: ordered. Decision-making details documented in ED Course. Radiology: ordered and independent interpretation performed. Decision-making details documented in ED Course.  Risk Prescription drug management.     Final Clinical Impression(s) / ED Diagnoses Final diagnoses:  Acute cystitis without hematuria    Rx / DC Orders ED Discharge Orders          Ordered    cephALEXin (KEFLEX) 500 MG capsule  2 times daily        02/29/24 0109             Charmayne Cooper, MD 02/29/24 [id number]

## 2024-02-29 LAB — URINALYSIS, W/ REFLEX TO CULTURE (INFECTION SUSPECTED)
Bacteria, UA: NONE SEEN
Bilirubin Urine: NEGATIVE
Glucose, UA: NEGATIVE mg/dL
Ketones, ur: 5 mg/dL — AB
Nitrite: NEGATIVE
Protein, ur: 30 mg/dL — AB
Specific Gravity, Urine: >1.046 — ABNORMAL HIGH (ref 1.005–1.030)
WBC, UA: >50 WBC/hpf (ref 0–5)
pH: 5 (ref 5.0–8.0)

## 2024-02-29 MED ORDER — SODIUM CHLORIDE 0.9 % IV SOLN
1.0000 g | Freq: Once | INTRAVENOUS | Status: AC
  Administered 2024-02-29: 1 g via INTRAVENOUS
  Filled 2024-02-29: qty 10

## 2024-02-29 MED ORDER — CEPHALEXIN 500 MG PO CAPS: 500.0000 mg | ORAL_CAPSULE | Freq: Two times a day (BID) | ORAL | 0 refills | Status: AC

## 2024-03-01 LAB — URINE CULTURE: Culture: NO GROWTH

## 2024-03-29 ENCOUNTER — Encounter (HOSPITAL_COMMUNITY): Payer: Self-pay

## 2024-03-29 ENCOUNTER — Other Ambulatory Visit: Payer: Self-pay

## 2024-03-29 ENCOUNTER — Emergency Department (HOSPITAL_COMMUNITY)
Admission: EM | Admit: 2024-03-29 | Discharge: 2024-03-29 | Disposition: A | Discharge status: Home / Self Care | Attending: Emergency Medicine | Admitting: Emergency Medicine

## 2024-03-29 DIAGNOSIS — K047 Periapical abscess without sinus: Secondary | ICD-10-CM | POA: Insufficient documentation

## 2024-03-29 DIAGNOSIS — K0889 Other specified disorders of teeth and supporting structures: Secondary | ICD-10-CM | POA: Diagnosis present

## 2024-03-29 DIAGNOSIS — R519 Headache, unspecified: Secondary | ICD-10-CM

## 2024-03-29 MED ORDER — NAPROXEN 500 MG PO TABS: 500.0000 mg | ORAL_TABLET | Freq: Two times a day (BID) | ORAL | 0 refills | Status: AC

## 2024-03-29 MED ORDER — AMOXICILLIN 500 MG PO CAPS: 500.0000 mg | ORAL_CAPSULE | Freq: Three times a day (TID) | ORAL | 0 refills | Status: AC

## 2024-03-29 NOTE — Discharge Instructions (Signed)
 Please follow-up with a dentist on an outpatient basis.  Return to emergency department immediately for any new or worsening symptoms.

## 2024-03-29 NOTE — ED Triage Notes (Signed)
 Pt arrived via POV c/o a toothache and now a headache for past couple of days. Pt reports pain has caused him to be unable to work due to the pain.

## 2024-03-29 NOTE — ED Provider Notes (Signed)
 [place] EMERGENCY DEPARTMENT AT [facility] Provider Note   CSN: [id number] Arrival date & time: 03/29/24  0940     Patient presents with: Headache   Raymond Mcintyre is a 48 y.o. male.   Patient is a 48 year old male who presents emergency department the chief complaint of left upper dental pain and headache which has been ongoing for approximate the past 3 days.  He denies any associated stridor, dysphagia, drooling, dyspnea.  He denies any pain to his neck or back.  He has had no associated fever, chills, chest pain, shortness of breath, abdominal pain, nausea, vomiting, diarrhea.  He notes he does not follow with a dentist.  He had no associated thunderclap nature of the headache, recent falls or blunt head trauma.   Headache      Prior to Admission medications   Medication Sig Start Date End Date Taking? Authorizing Provider  acetaminophen  (TYLENOL ) 500 MG tablet Take 1,000 mg by mouth every 6 (six) hours as needed for mild pain.    [provider]  famotidine  (PEPCID ) 20 MG tablet Take 1 tablet (20 mg total) by mouth 2 (two) times daily. 10/26/19   Trudy Dorn BRAVO, MD  famotidine  (PEPCID ) 20 MG tablet Take 1 tablet (20 mg total) by mouth daily. 01/05/20 01/04/21  Claudene Tanda POUR, PA-C  ketorolac  (TORADOL ) 10 MG tablet Take 1 tablet (10 mg total) by mouth every 6 (six) hours as needed. 01/05/20   Claudene Tanda POUR, PA-C  ondansetron  (ZOFRAN ) 4 MG tablet Take 1 tablet (4 mg total) by mouth every 6 (six) hours. 07/12/21   Waylan Elsie PARAS, PA-C  pantoprazole  (PROTONIX ) 20 MG tablet Take 1 tablet (20 mg total) by mouth daily. 04/25/20 05/25/20  Ernest Ronal BRAVO, MD  sucralfate  (CARAFATE ) 1 g tablet Take 1 tablet (1 g total) by mouth 4 (four) times daily -  with meals and at bedtime. 04/25/20 05/25/20  Ernest Ronal BRAVO, MD    Allergies: Patient has no known allergies.    Review of Systems  Neurological:  Positive for headaches.  All other systems reviewed and are  negative.   Updated Vital Signs BP (!) 157/114 (BP Location: Right Arm)   Pulse 90   Temp 98 F (36.7 C) (Temporal)   Resp 16   Ht 6' 4 (1.93 m)   Wt 99.8 kg   SpO2 99%   BMI 26.78 kg/m   Physical Exam Vitals and nursing note reviewed.  Constitutional:      Appearance: Normal appearance.  HENT:     Head: Normocephalic and atraumatic.     Nose: Nose normal.     Mouth/Throat:     Mouth: Mucous membranes are moist.     Comments: Swelling noted to the left upper gumline, no areas of obvious drainable abscess, no peritonsillar swelling, floor mouth is soft, tolerant secretions without difficulty  Eyes:     Extraocular Movements: Extraocular movements intact.     Conjunctiva/sclera: Conjunctivae normal.     Pupils: Pupils are equal, round, and reactive to light.    Cardiovascular:     Rate and Rhythm: Normal rate and regular rhythm.     Pulses: Normal pulses.     Heart sounds: Normal heart sounds.  Pulmonary:     Effort: Pulmonary effort is normal.     Breath sounds: Normal breath sounds.  Abdominal:     General: Abdomen is flat.   Musculoskeletal:        General: Normal range of motion.  Cervical back: Normal range of motion and neck supple. No rigidity.  Lymphadenopathy:     Cervical: No cervical adenopathy.   Skin:    General: Skin is warm and dry.   Neurological:     General: No focal deficit present.     Mental Status: He is alert and oriented to person, place, and time. Mental status is at baseline.     Cranial Nerves: No cranial nerve deficit, dysarthria or facial asymmetry.     Sensory: No sensory deficit.     Motor: No weakness.     Coordination: Coordination normal.     Gait: Gait normal.   Psychiatric:        Mood and Affect: Mood normal.        Behavior: Behavior normal.        Thought Content: Thought content normal.        Judgment: Judgment normal.     (all labs ordered are listed, but only abnormal results are displayed) Labs Reviewed  - No data to display  EKG: None  Radiology: No results found.   Procedures   Medications Ordered in the ED - No data to display                                  Medical Decision Making Patient is doing well at this time and is stable for discharge home.  Discussed with patient that we will cover him for any dental infection at this point.  He has no indication for acute peritonsillar abscess, Ludwig's angina, retropharyngeal abscess, epiglottitis.  Suspect that his headache is secondary to his dental pain at this point.  He has no concerning neurological deficits.  He has no clinical indication for subarachnoid hemorrhage, space-occupying lesion, meningitis.  Discussed the need for close follow-up with a dentist on an outpatient basis.  Strict turn precautions were discussed for any new or worsening symptoms.  Patient voiced understanding to the plan and had no additional questions.  Risk Prescription drug management.        Final diagnoses:  None    ED Discharge Orders     None          Daralene Lonni JONETTA DEVONNA 03/29/24 1054    Yolande Lamar BROCKS, MD 03/30/24 [phone]

## 2024-06-15 ENCOUNTER — Encounter (INDEPENDENT_AMBULATORY_CARE_PROVIDER_SITE_OTHER): Payer: Self-pay | Admitting: *Deleted
